# Patient Record
Sex: Male | Born: 1955
Health system: Southern US, Community
[De-identification: ages and names within clinical notes are randomized; demographics above are authoritative.]

## PROBLEM LIST (undated history)

## (undated) DIAGNOSIS — N529 Male erectile dysfunction, unspecified: Secondary | ICD-10-CM

## (undated) DIAGNOSIS — Z86010 Personal history of colonic polyps: Secondary | ICD-10-CM

## (undated) DIAGNOSIS — Z860101 Personal history of adenomatous and serrated colon polyps: Secondary | ICD-10-CM

## (undated) DIAGNOSIS — J302 Other seasonal allergic rhinitis: Secondary | ICD-10-CM

## (undated) DIAGNOSIS — M1990 Unspecified osteoarthritis, unspecified site: Secondary | ICD-10-CM

## (undated) HISTORY — PX: KNEE ARTHROSCOPY: SUR90

## (undated) HISTORY — PX: APPENDECTOMY: SHX54

## (undated) HISTORY — PX: COLONOSCOPY: SHX174

---

## 1998-09-06 ENCOUNTER — Emergency Department (HOSPITAL_COMMUNITY): Admission: EM | Admit: 1998-09-06 | Discharge: 1998-09-06 | Payer: Self-pay | Admitting: Emergency Medicine

## 2002-07-14 ENCOUNTER — Ambulatory Visit (HOSPITAL_BASED_OUTPATIENT_CLINIC_OR_DEPARTMENT_OTHER): Admission: RE | Admit: 2002-07-14 | Discharge: 2002-07-14 | Payer: Self-pay | Admitting: Orthopedic Surgery

## 2006-05-26 ENCOUNTER — Emergency Department (HOSPITAL_COMMUNITY): Admission: EM | Admit: 2006-05-26 | Discharge: 2006-05-26 | Payer: Self-pay | Admitting: Emergency Medicine

## 2014-04-04 ENCOUNTER — Emergency Department (HOSPITAL_COMMUNITY)
Admission: EM | Admit: 2014-04-04 | Discharge: 2014-04-04 | Disposition: A | Discharge status: Home / Self Care | Payer: BC Managed Care – PPO | Attending: Emergency Medicine | Admitting: Emergency Medicine

## 2014-04-04 ENCOUNTER — Encounter (HOSPITAL_COMMUNITY): Payer: Self-pay | Admitting: Emergency Medicine

## 2014-04-04 DIAGNOSIS — R21 Rash and other nonspecific skin eruption: Secondary | ICD-10-CM | POA: Diagnosis present

## 2014-04-04 DIAGNOSIS — L509 Urticaria, unspecified: Secondary | ICD-10-CM | POA: Diagnosis not present

## 2014-04-04 DIAGNOSIS — Z791 Long term (current) use of non-steroidal anti-inflammatories (NSAID): Secondary | ICD-10-CM | POA: Diagnosis not present

## 2014-04-04 DIAGNOSIS — Z79899 Other long term (current) drug therapy: Secondary | ICD-10-CM | POA: Diagnosis not present

## 2014-04-04 MED ORDER — DIPHENHYDRAMINE HCL 25 MG PO TABS: 25.0000 mg | ORAL_TABLET | Freq: Four times a day (QID) | ORAL | Status: AC | PRN

## 2014-04-04 MED ORDER — PREDNISONE 20 MG PO TABS: 40.0000 mg | ORAL_TABLET | Freq: Every day | ORAL | Status: AC

## 2014-04-04 MED ORDER — DIPHENHYDRAMINE HCL 25 MG PO CAPS
50.0000 mg | ORAL_CAPSULE | Freq: Once | ORAL | Status: AC
  Administered 2014-04-04: 50 mg via ORAL
  Filled 2014-04-04: qty 2

## 2014-04-04 MED ORDER — PREDNISONE 20 MG PO TABS
60.0000 mg | ORAL_TABLET | Freq: Once | ORAL | Status: AC
  Administered 2014-04-04: 60 mg via ORAL
  Filled 2014-04-04: qty 3

## 2014-04-04 NOTE — ED Notes (Signed)
Pt presents with hives to arms, legs, feet hands @ 45 min ago. unkn allergin. No airway involvement at this time.

## 2014-04-04 NOTE — Discharge Instructions (Signed)
Allergies °Allergies may happen from anything your body is sensitive to. This may be food, medicines, pollens, chemicals, and nearly anything around you in everyday life that produces allergens. An allergen is anything that causes an allergy producing substance. Heredity is often a factor in causing these problems. This means you may have some of the same allergies as your parents. °Food allergies happen in all age groups. Food allergies are some of the most severe and life threatening. Some common food allergies are cow's milk, seafood, eggs, nuts, wheat, and soybeans. °SYMPTOMS  °· Swelling around the mouth. °· An itchy red rash or hives. °· Vomiting or diarrhea. °· Difficulty breathing. °SEVERE ALLERGIC REACTIONS ARE LIFE-THREATENING. °This reaction is called anaphylaxis. It can cause the mouth and throat to swell and cause difficulty with breathing and swallowing. In severe reactions only a trace amount of food (for example, peanut oil in a salad) may cause death within seconds. °Seasonal allergies occur in all age groups. These are seasonal because they usually occur during the same season every year. They may be a reaction to molds, grass pollens, or tree pollens. Other causes of problems are house dust mite allergens, pet dander, and mold spores. The symptoms often consist of nasal congestion, a runny itchy nose associated with sneezing, and tearing itchy eyes. There is often an associated itching of the mouth and ears. The problems happen when you come in contact with pollens and other allergens. Allergens are the particles in the air that the body reacts to with an allergic reaction. This causes you to release allergic antibodies. Through a chain of events, these eventually cause you to release histamine into the blood stream. Although it is meant to be protective to the body, it is this release that causes your discomfort. This is why you were given anti-histamines to feel better.  If you are unable to  pinpoint the offending allergen, it may be determined by skin or blood testing. Allergies cannot be cured but can be controlled with medicine. °Hay fever is a collection of all or some of the seasonal allergy problems. It may often be treated with simple over-the-counter medicine such as diphenhydramine. Take medicine as directed. Do not drink alcohol or drive while taking this medicine. Check with your caregiver or package insert for child dosages. °If these medicines are not effective, there are many new medicines your caregiver can prescribe. Stronger medicine such as nasal spray, eye drops, and corticosteroids may be used if the first things you try do not work well. Other treatments such as immunotherapy or desensitizing injections can be used if all else fails. Follow up with your caregiver if problems continue. These seasonal allergies are usually not life threatening. They are generally more of a nuisance that can often be handled using medicine. °HOME CARE INSTRUCTIONS  °· If unsure what causes a reaction, keep a diary of foods eaten and symptoms that follow. Avoid foods that cause reactions. °· If hives or rash are present: °¨ Take medicine as directed. °¨ You may use an over-the-counter antihistamine (diphenhydramine) for hives and itching as needed. °¨ Apply cold compresses (cloths) to the skin or take baths in cool water. Avoid hot baths or showers. Heat will make a rash and itching worse. °· If you are severely allergic: °¨ Following a treatment for a severe reaction, hospitalization is often required for closer follow-up. °¨ Wear a medic-alert bracelet or necklace stating the allergy. °¨ You and your family must learn how to give adrenaline or use   an anaphylaxis kit. °· If you have had a severe reaction, always carry your anaphylaxis kit or EpiPen® with you. Use this medicine as directed by your caregiver if a severe reaction is occurring. Failure to do so could have a fatal outcome. °SEEK MEDICAL  CARE IF: °· You suspect a food allergy. Symptoms generally happen within 30 minutes of eating a food. °· Your symptoms have not gone away within 2 days or are getting worse. °· You develop new symptoms. °· You want to retest yourself or your child with a food or drink you think causes an allergic reaction. Never do this if an anaphylactic reaction to that food or drink has happened before. Only do this under the care of a caregiver. °SEEK IMMEDIATE MEDICAL CARE IF:  °· You have difficulty breathing, are wheezing, or have a tight feeling in your chest or throat. °· You have a swollen mouth, or you have hives, swelling, or itching all over your body. °· You have had a severe reaction that has responded to your anaphylaxis kit or an EpiPen®. These reactions may return when the medicine has worn off. These reactions should be considered life threatening. °MAKE SURE YOU:  °· Understand these instructions. °· Will watch your condition. °· Will get help right away if you are not doing well or get worse. °Document Released: 08/21/2002 Document Revised: 09/22/2012 Document Reviewed: 01/26/2008 °ExitCare® Patient Information ©2015 [employer]. This information is not intended to replace advice given to you by your health care provider. Make sure you discuss any questions you have with your health care provider. °Hives °Hives are itchy, red, swollen areas of the skin. They can vary in size and location on your body. Hives can come and go for hours or several days (acute hives) or for several weeks (chronic hives). Hives do not spread from person to person (noncontagious). They may get worse with scratching, exercise, and emotional stress. °CAUSES  °· Allergic reaction to food, additives, or drugs. °· Infections, including the common cold. °· Illness, such as vasculitis, lupus, or thyroid disease. °· Exposure to sunlight, heat, or cold. °· Exercise. °· Stress. °· Contact with chemicals. °SYMPTOMS  °· Red or white swollen  patches on the skin. The patches may change size, shape, and location quickly and repeatedly. °· Itching. °· Swelling of the hands, feet, and face. This may occur if hives develop deeper in the skin. °DIAGNOSIS  °Your caregiver can usually tell what is wrong by performing a physical exam. Skin or blood tests may also be done to determine the cause of your hives. In some cases, the cause cannot be determined. °TREATMENT  °Mild cases usually get better with medicines such as antihistamines. Severe cases may require an emergency epinephrine injection. If the cause of your hives is known, treatment includes avoiding that trigger.  °HOME CARE INSTRUCTIONS  °· Avoid causes that trigger your hives. °· Take antihistamines as directed by your caregiver to reduce the severity of your hives. Non-sedating or low-sedating antihistamines are usually recommended. Do not drive while taking an antihistamine. °· Take any other medicines prescribed for itching as directed by your caregiver. °· Wear loose-fitting clothing. °· Keep all follow-up appointments as directed by your caregiver. °SEEK MEDICAL CARE IF:  °· You have persistent or severe itching that is not relieved with medicine. °· You have painful or swollen joints. °SEEK IMMEDIATE MEDICAL CARE IF:  °· You have a fever. °· Your tongue or lips are swollen. °· You have trouble breathing   or swallowing. °· You feel tightness in the throat or chest. °· You have abdominal pain. °These problems may be the first sign of a life-threatening allergic reaction. Call your local emergency services (911 in [place] °MAKE SURE YOU:  °· Understand these instructions. °· Will watch your condition. °· Will get help right away if you are not doing well or get worse. °Document Released: 05/28/2005 Document Revised: 06/02/2013 Document Reviewed: 08/21/2011 °ExitCare® Patient Information ©2015 [employer]. This information is not intended to replace advice given to you by your health care provider.  Make sure you discuss any questions you have with your health care provider. ° °

## 2014-04-04 NOTE — ED Provider Notes (Signed)
CSN: [id number]     Arrival date & time 04/04/14  2140 History   First MD Initiated Contact with Patient 04/04/14 2157     Chief Complaint  Patient presents with  . Allergic Reaction     (Consider location/radiation/quality/duration/timing/severity/associated sxs/prior Treatment) HPI 58 year old male presents with a rash that started approximately 1 hour prior to arrival. He states he was lying in bed and felt itching starting in his lower abdomen/groin. The rashes started to spread. It started a small dots that have been coalescing and getting bigger. Denies shortness of breath, nausea, or vomiting. No throat closing sensation. No swelling. He's unsure what set this off. He has no significant allergic history has not been having any new medicines, detergents, clothes, or foods. He ate food that he cooked tonight that he's had many times before. He has a new dog but has had the dog for the past 2 weeks. He feels like the rash is spreading quickly.  History reviewed. No pertinent past medical history. Past Surgical History  Procedure Laterality Date  . Appendectomy     No family history on file. History  Substance Use Topics  . Smoking status: Never Smoker   . Smokeless tobacco: Not on file  . Alcohol Use: Yes    Review of Systems  HENT: Negative for trouble swallowing and voice change.   Respiratory: Negative for chest tightness, shortness of breath and wheezing.   Gastrointestinal: Negative for nausea and vomiting.  Skin: Positive for rash.  All other systems reviewed and are negative.     Allergies  Review of patient's allergies indicates no known allergies.  Home Medications   Prior to Admission medications   Medication Sig Start Date End Date Taking? Authorizing Provider  meloxicam (MOBIC) 15 MG tablet Take 1 tablet by mouth daily. 03/16/14   Historical Provider, MD  naproxen (NAPROSYN) 500 MG tablet Take 500 mg by mouth 2 (two) times daily as needed for mild pain.    Yes Historical Provider, MD  Red Yeast Rice 600 MG CAPS Take 1 capsule by mouth daily.   Yes Historical Provider, MD  VIAGRA 100 MG tablet Take 1 tablet by mouth as needed. Before sexual intercourse 02/04/14  Yes Historical Provider, MD   BP 133/85  Pulse 90  Temp(Src) 98.8 F (37.1 C) (Oral)  Resp 18  Ht 5\' 9"  (1.753 m)  Wt 190 lb (86.183 kg)  BMI 28.05 kg/m2  SpO2 98% Physical Exam  Nursing note and vitals reviewed. Constitutional: He is oriented to person, place, and time. He appears well-developed and well-nourished.  HENT:  Head: Normocephalic and atraumatic.  Right Ear: External ear normal.  Left Ear: External ear normal.  Nose: Nose normal.  Eyes: Right eye exhibits no discharge. Left eye exhibits no discharge.  Neck: Neck supple.  Cardiovascular: Normal rate, regular rhythm, normal heart sounds and intact distal pulses.   Pulmonary/Chest: Effort normal and breath sounds normal. No stridor. He has no wheezes.  Abdominal: Soft. He exhibits no distension. There is no tenderness.  Genitourinary: Testes normal and penis normal.  Musculoskeletal: He exhibits no edema.  Neurological: He is alert and oriented to person, place, and time.  Skin: Skin is warm and dry. Rash noted. Rash is urticarial. He is not diaphoretic.     Urticarial rash, most prominent in the proximal thighs and lower abdomen. No penile or scrotal involvement. Also noticed in the upper proximal arms.    ED Course  Procedures (including critical care time) Labs Review  Labs Reviewed - No data to display  Imaging Review No results found.   EKG Interpretation None      MDM   Final diagnoses:  Hives    Patient with hives of unclear etiology. The rash seemed to rapidly improve with oral Benadryl and steroids. He has no signs of airway or GI involvement. Due to this, will treat symptomatically with oral Benadryl and a steroid burst. Patient will need outpatient follow-up with an allergist. Discussed  strict return precautions.    Audree CamelScott T Cauy Melody, MD 04/04/14 [phone]

## 2017-07-04 ENCOUNTER — Telehealth: Payer: Self-pay | Admitting: Cardiology

## 2017-07-09 NOTE — Telephone Encounter (Signed)
error 

## 2017-10-13 ENCOUNTER — Other Ambulatory Visit: Payer: Self-pay

## 2017-10-13 ENCOUNTER — Inpatient Hospital Stay (HOSPITAL_COMMUNITY)
Admission: EM | Admit: 2017-10-13 | Discharge: 2017-10-16 | DRG: 357 | Disposition: A | Discharge status: Home / Self Care | Payer: No Typology Code available for payment source | Attending: Family Medicine | Admitting: Family Medicine

## 2017-10-13 ENCOUNTER — Encounter (HOSPITAL_COMMUNITY): Payer: Self-pay | Admitting: Obstetrics and Gynecology

## 2017-10-13 DIAGNOSIS — R000 Tachycardia, unspecified: Secondary | ICD-10-CM | POA: Diagnosis not present

## 2017-10-13 DIAGNOSIS — Z86010 Personal history of colonic polyps: Secondary | ICD-10-CM | POA: Diagnosis not present

## 2017-10-13 DIAGNOSIS — Z7982 Long term (current) use of aspirin: Secondary | ICD-10-CM

## 2017-10-13 DIAGNOSIS — D696 Thrombocytopenia, unspecified: Secondary | ICD-10-CM | POA: Diagnosis present

## 2017-10-13 DIAGNOSIS — E538 Deficiency of other specified B group vitamins: Secondary | ICD-10-CM | POA: Diagnosis present

## 2017-10-13 DIAGNOSIS — K264 Chronic or unspecified duodenal ulcer with hemorrhage: Principal | ICD-10-CM | POA: Diagnosis present

## 2017-10-13 DIAGNOSIS — N529 Male erectile dysfunction, unspecified: Secondary | ICD-10-CM | POA: Diagnosis present

## 2017-10-13 DIAGNOSIS — Z79899 Other long term (current) drug therapy: Secondary | ICD-10-CM

## 2017-10-13 DIAGNOSIS — Z791 Long term (current) use of non-steroidal anti-inflammatories (NSAID): Secondary | ICD-10-CM

## 2017-10-13 DIAGNOSIS — F17290 Nicotine dependence, other tobacco product, uncomplicated: Secondary | ICD-10-CM | POA: Diagnosis present

## 2017-10-13 DIAGNOSIS — M1990 Unspecified osteoarthritis, unspecified site: Secondary | ICD-10-CM | POA: Diagnosis not present

## 2017-10-13 DIAGNOSIS — K922 Gastrointestinal hemorrhage, unspecified: Secondary | ICD-10-CM

## 2017-10-13 DIAGNOSIS — K920 Hematemesis: Secondary | ICD-10-CM | POA: Diagnosis not present

## 2017-10-13 DIAGNOSIS — T39395A Adverse effect of other nonsteroidal anti-inflammatory drugs [NSAID], initial encounter: Secondary | ICD-10-CM | POA: Diagnosis present

## 2017-10-13 DIAGNOSIS — D62 Acute posthemorrhagic anemia: Secondary | ICD-10-CM | POA: Diagnosis not present

## 2017-10-13 DIAGNOSIS — D519 Vitamin B12 deficiency anemia, unspecified: Secondary | ICD-10-CM | POA: Diagnosis not present

## 2017-10-13 DIAGNOSIS — K449 Diaphragmatic hernia without obstruction or gangrene: Secondary | ICD-10-CM | POA: Diagnosis present

## 2017-10-13 DIAGNOSIS — K921 Melena: Secondary | ICD-10-CM | POA: Diagnosis not present

## 2017-10-13 HISTORY — DX: Personal history of colonic polyps: Z86.010

## 2017-10-13 HISTORY — DX: Other seasonal allergic rhinitis: J30.2

## 2017-10-13 HISTORY — DX: Personal history of adenomatous and serrated colon polyps: Z86.0101

## 2017-10-13 HISTORY — DX: Unspecified osteoarthritis, unspecified site: M19.90

## 2017-10-13 HISTORY — DX: Male erectile dysfunction, unspecified: N52.9

## 2017-10-13 LAB — COMPREHENSIVE METABOLIC PANEL
ALK PHOS: 57 U/L (ref 38–126)
ALT: 33 U/L (ref 17–63)
AST: 29 U/L (ref 15–41)
Albumin: 4.1 g/dL (ref 3.5–5.0)
Anion gap: 12 (ref 5–15)
BUN: 29 mg/dL — AB (ref 6–20)
CHLORIDE: 104 mmol/L (ref 101–111)
CO2: 22 mmol/L (ref 22–32)
Calcium: 9.6 mg/dL (ref 8.9–10.3)
Creatinine, Ser: 1 mg/dL (ref 0.61–1.24)
GFR calc Af Amer: >60 mL/min (ref >60)
GLUCOSE: 150 mg/dL — AB (ref 65–99)
POTASSIUM: 4.1 mmol/L (ref 3.5–5.1)
Sodium: 138 mmol/L (ref 135–145)
Total Bilirubin: 1.5 mg/dL — ABNORMAL HIGH (ref 0.3–1.2)
Total Protein: 7.2 g/dL (ref 6.5–8.1)

## 2017-10-13 LAB — POC OCCULT BLOOD, ED: Fecal Occult Bld: POSITIVE — AB

## 2017-10-13 LAB — RETICULOCYTES
RBC.: 2.95 MIL/uL — AB (ref 4.22–5.81)
RETIC CT PCT: 2.9 % (ref 0.4–3.1)
Retic Count, Absolute: 85.6 10*3/uL (ref 19.0–186.0)

## 2017-10-13 LAB — CBC
HCT: 31 % — ABNORMAL LOW (ref 39.0–52.0)
HEMATOCRIT: 40.8 % (ref 39.0–52.0)
HEMOGLOBIN: 10.7 g/dL — AB (ref 13.0–17.0)
Hemoglobin: 14.1 g/dL (ref 13.0–17.0)
MCH: 35.7 pg — ABNORMAL HIGH (ref 26.0–34.0)
MCH: 35.7 pg — ABNORMAL HIGH (ref 26.0–34.0)
MCHC: 34.6 g/dL (ref 30.0–36.0)
MCHC: 34.5 g/dL (ref 30.0–36.0)
MCV: 103.3 fL — AB (ref 78.0–100.0)
MCV: 103.3 fL — ABNORMAL HIGH (ref 78.0–100.0)
PLATELETS: 162 10*3/uL (ref 150–400)
Platelets: 238 10*3/uL (ref 150–400)
RBC: 3.95 MIL/uL — ABNORMAL LOW (ref 4.22–5.81)
RBC: 3 MIL/uL — AB (ref 4.22–5.81)
RDW: 12.8 % (ref 11.5–15.5)
RDW: 13 % (ref 11.5–15.5)
WBC: 10.4 10*3/uL (ref 4.0–10.5)
WBC: 8 10*3/uL (ref 4.0–10.5)

## 2017-10-13 LAB — PROTIME-INR
INR: 1.03
Prothrombin Time: 13.4 seconds (ref 11.4–15.2)

## 2017-10-13 LAB — ABO/RH: ABO/RH(D): O POS

## 2017-10-13 MED ORDER — ONDANSETRON HCL 4 MG PO TABS: 4.0000 mg | ORAL_TABLET | Freq: Four times a day (QID) | ORAL | Status: DC | PRN

## 2017-10-13 MED ORDER — SODIUM CHLORIDE 0.9 % IV SOLN
8.0000 mg/h | INTRAVENOUS | Status: DC
  Administered 2017-10-13 – 2017-10-15 (×5): 8 mg/h via INTRAVENOUS
  Filled 2017-10-13 (×10): qty 80

## 2017-10-13 MED ORDER — FAMOTIDINE IN NACL 20-0.9 MG/50ML-% IV SOLN
20.0000 mg | Freq: Once | INTRAVENOUS | Status: AC
  Administered 2017-10-13: 20 mg via INTRAVENOUS
  Filled 2017-10-13: qty 50

## 2017-10-13 MED ORDER — PANTOPRAZOLE SODIUM 40 MG IV SOLR
40.0000 mg | Freq: Once | INTRAVENOUS | Status: AC
  Administered 2017-10-13: 40 mg via INTRAVENOUS
  Filled 2017-10-13: qty 40

## 2017-10-13 MED ORDER — DIPHENHYDRAMINE HCL 50 MG/ML IJ SOLN
25.0000 mg | Freq: Once | INTRAMUSCULAR | Status: AC
  Administered 2017-10-13: 25 mg via INTRAVENOUS
  Filled 2017-10-13: qty 1

## 2017-10-13 MED ORDER — PANTOPRAZOLE SODIUM 40 MG IV SOLR: 40.0000 mg | Freq: Once | INTRAVENOUS | Status: DC

## 2017-10-13 MED ORDER — PANTOPRAZOLE SODIUM 40 MG IV SOLR: 40.0000 mg | Freq: Two times a day (BID) | INTRAVENOUS | Status: DC

## 2017-10-13 MED ORDER — DICLOFENAC SODIUM 1 % TD GEL
2.0000 g | Freq: Four times a day (QID) | TRANSDERMAL | Status: DC
  Administered 2017-10-13 – 2017-10-16 (×7): 2 g via TOPICAL
  Filled 2017-10-13: qty 100

## 2017-10-13 MED ORDER — POTASSIUM CHLORIDE IN NACL 20-0.9 MEQ/L-% IV SOLN
INTRAVENOUS | Status: DC
  Administered 2017-10-13 – 2017-10-14 (×2): via INTRAVENOUS
  Filled 2017-10-13 (×3): qty 1000

## 2017-10-13 MED ORDER — ZOLPIDEM TARTRATE 5 MG PO TABS
5.0000 mg | ORAL_TABLET | Freq: Once | ORAL | Status: AC
  Administered 2017-10-13: 5 mg via ORAL
  Filled 2017-10-13: qty 1

## 2017-10-13 MED ORDER — SODIUM CHLORIDE 0.9 % IV SOLN
Freq: Once | INTRAVENOUS | Status: AC
Start: 2017-10-13 — End: 2017-10-13
  Administered 2017-10-13: 15:00:00 via INTRAVENOUS

## 2017-10-13 MED ORDER — PANTOPRAZOLE SODIUM 40 MG PO TBEC
40.0000 mg | DELAYED_RELEASE_TABLET | Freq: Two times a day (BID) | ORAL | Status: DC
  Administered 2017-10-13: 40 mg via ORAL
  Filled 2017-10-13: qty 1

## 2017-10-13 MED ORDER — ACETAMINOPHEN 325 MG PO TABS: 650.0000 mg | ORAL_TABLET | Freq: Four times a day (QID) | ORAL | Status: DC | PRN

## 2017-10-13 MED ORDER — ONDANSETRON HCL 4 MG/2ML IJ SOLN: 4.0000 mg | Freq: Four times a day (QID) | INTRAMUSCULAR | Status: DC | PRN

## 2017-10-13 MED ORDER — ACETAMINOPHEN 650 MG RE SUPP: 650.0000 mg | Freq: Four times a day (QID) | RECTAL | Status: DC | PRN

## 2017-10-13 MED ORDER — SODIUM CHLORIDE 0.9 % IV BOLUS
1000.0000 mL | Freq: Once | INTRAVENOUS | Status: AC
  Administered 2017-10-13: 1000 mL via INTRAVENOUS

## 2017-10-13 MED ORDER — METOCLOPRAMIDE HCL 5 MG/ML IJ SOLN
10.0000 mg | Freq: Once | INTRAMUSCULAR | Status: AC
  Administered 2017-10-13: 10 mg via INTRAVENOUS
  Filled 2017-10-13: qty 2

## 2017-10-13 NOTE — ED Triage Notes (Signed)
Per Pt: Pt reports he has been having dark stools with blood and has had multiple rounds of emesis with blood. Pt feels weak and just wants to sleep. Pt is a current drinker and reports about 8 drinks per week.

## 2017-10-13 NOTE — ED Provider Notes (Addendum)
[facility]-EMERGENCY DEPT Provider Note   CSN: [id number] Arrival date & time: 10/13/17  1206     History   Chief Complaint Chief Complaint  Patient presents with  . Blood In Stools  . Hematemesis    HPI Kenneth Robbins is a 62 y.o. male.  HPI 62 yo M with no significant PMHx here with nausea, black emesis. Pt reports that over the last 3-4 days, he's had progressively worsening nausea, intermittent epigastric pain, and vomiting. His emesis began as blood-streaked then has been dark, tarry, with coffee grounds. He's begun having dark, tarry BM as well. He reports associated severe fatigue, lightheadedness, and general malaise. Pain is cramp like, intermittent, and is not severe. No pain currently. Of note, he uses [employer] daily for his arthritis. He also drinks 4-6 shots/alcohol equivalents on 3-5 days a week. No blood thinner use. No h/o GI bleeds.   Past Medical History:  Diagnosis Date  . Erectile dysfunction   . Hx of adenomatous polyp of colon   . Osteoarthritis   . Seasonal allergies     Patient Active Problem List   Diagnosis Date Noted  . Arthritis 10/13/2017  . Tachycardia 10/13/2017  . Upper GI bleed 10/13/2017    Past Surgical History:  Procedure Laterality Date  . APPENDECTOMY    . COLONOSCOPY    . ESOPHAGOGASTRODUODENOSCOPY (EGD) WITH PROPOFOL N/A 10/14/2017   Procedure: ESOPHAGOGASTRODUODENOSCOPY (EGD) WITH PROPOFOL;  Surgeon: Kathi Der, MD;  Location: [facility] ENDOSCOPY;  Service: Gastroenterology;  Laterality: N/A;  . IR ANGIOGRAM SELECTIVE EACH ADDITIONAL VESSEL  10/14/2017  . IR ANGIOGRAM VISCERAL SELECTIVE  10/14/2017  . IR ANGIOGRAM VISCERAL SELECTIVE  10/14/2017  . IR EMBO ART  VEN HEMORR LYMPH EXTRAV  INC GUIDE ROADMAPPING  10/14/2017  . IR US GUIDE VASC ACCESS RIGHT  10/14/2017  . KNEE ARTHROSCOPY Bilateral         Home Medications    Prior to Admission medications   Medication Sig Start Date End Date Taking? Authorizing Provider   diphenhydrAMINE (BENADRYL) 25 MG tablet Take 1-2 tablets (25-50 mg total) by mouth every 6 (six) hours as needed for itching or allergies. 04/04/14  Yes Pricilla Loveless, MD  fluticasone (FLONASE) 50 MCG/ACT nasal spray Place 1 spray into both nostrils daily.   Yes [provider]  Multiple Vitamin (MULTIVITAMIN WITH MINERALS) TABS tablet Take 1 tablet by mouth daily.   Yes [provider]  VIAGRA 100 MG tablet Take 1 tablet by mouth as needed. Before sexual intercourse 02/04/14  Yes [provider]  pantoprazole (PROTONIX) 40 MG tablet Take 1 tablet (40 mg total) by mouth 2 (two) times daily before a meal. 10/16/17   Penny Pia, MD    Family History Family History  Problem Relation Age of Onset  . Cancer Mother   . Heart failure Father     Social History Social History   Tobacco Use  . Smoking status: Light Tobacco Smoker    Types: Cigars  . Smokeless tobacco: Never Used  Substance Use Topics  . Alcohol use: Yes    Comment: 8 drinks per week  . Drug use: Yes    Types: Marijuana     Allergies   Patient has no known allergies.   Review of Systems Review of Systems  Constitutional: Negative for chills, fatigue and fever.  HENT: Negative for congestion and rhinorrhea.   Eyes: Negative for visual disturbance.  Respiratory: Negative for cough, shortness of breath and wheezing.   Cardiovascular:  Negative for chest pain and leg swelling.  Gastrointestinal: Positive for abdominal pain, blood in stool, nausea and vomiting. Negative for diarrhea.  Genitourinary: Negative for dysuria and flank pain.  Musculoskeletal: Negative for neck pain and neck stiffness.  Skin: Negative for rash and wound.  Allergic/Immunologic: Negative for immunocompromised state.  Neurological: Negative for syncope, weakness and headaches.  All other systems reviewed and are negative.    Physical Exam Updated Vital Signs BP 124/66 (BP Location: Left Arm)   Pulse 87   Temp  98.6 F (37 C) (Oral)   Resp 18   Ht  (1.753 m)   Wt 78.9 kg (173 lb 15.1 oz)   SpO2 99%   BMI 25.69 kg/m   Physical Exam  Constitutional: He is oriented to person, place, and time. He appears well-developed and well-nourished. No distress.  HENT:  Head: Normocephalic and atraumatic.  Eyes: Conjunctivae are normal.  Neck: Neck supple.  Cardiovascular: Normal rate, regular rhythm and normal heart sounds. Exam reveals no friction rub.  No murmur heard. Pulmonary/Chest: Effort normal and breath sounds normal. No respiratory distress. He has no wheezes. He has no rales.  Abdominal: Bowel sounds are normal. He exhibits no distension. There is no tenderness. There is no rigidity, no rebound and no guarding.  Genitourinary:  Genitourinary Comments: Gross melena noted. No bright red blood  Musculoskeletal: He exhibits no edema.  Neurological: He is alert and oriented to person, place, and time. He exhibits normal muscle tone.  Skin: Skin is warm. Capillary refill takes less than 2 seconds.  Psychiatric: He has a normal mood and affect.  Nursing note and vitals reviewed.    ED Treatments / Results  Labs (all labs ordered are listed, but only abnormal results are displayed) Labs Reviewed  COMPREHENSIVE METABOLIC PANEL - Abnormal; Notable for the following components:      Result Value   Glucose, Bld 150 (*)    BUN 29 (*)    Total Bilirubin 1.5 (*)    All other components within normal limits  CBC - Abnormal; Notable for the following components:   RBC 3.95 (*)    MCV 103.3 (*)    MCH 35.7 (*)    All other components within normal limits  CBC - Abnormal; Notable for the following components:   RBC 3.00 (*)    Hemoglobin 10.7 (*)    HCT 31.0 (*)    MCV 103.3 (*)    MCH 35.7 (*)    All other components within normal limits  CBC - Abnormal; Notable for the following components:   RBC 2.07 (*)    Hemoglobin 7.6 (*)    HCT 21.5 (*)    MCV 103.9 (*)    MCH 36.7 (*)     Platelets 112 (*)    All other components within normal limits  VITAMIN B12 - Abnormal; Notable for the following components:   Vitamin B-12 123 (*)    All other components within normal limits  RETICULOCYTES - Abnormal; Notable for the following components:   RBC. 2.95 (*)    All other components within normal limits  BASIC METABOLIC PANEL - Abnormal; Notable for the following components:   Chloride 117 (*)    CO2 18 (*)    BUN 24 (*)    Calcium 6.5 (*)    Anion gap 4 (*)    All other components within normal limits  BASIC METABOLIC PANEL - Abnormal; Notable for the following components:   Glucose, Bld  104 (*)    Calcium 7.8 (*)    All other components within normal limits  HEMOGLOBIN AND HEMATOCRIT, BLOOD - Abnormal; Notable for the following components:   Hemoglobin 10.0 (*)    HCT 28.7 (*)    All other components within normal limits  CBC - Abnormal; Notable for the following components:   RBC 2.52 (*)    Hemoglobin 8.8 (*)    HCT 25.0 (*)    MCH 34.9 (*)    Platelets 104 (*)    All other components within normal limits  CBC - Abnormal; Notable for the following components:   RBC 2.51 (*)    Hemoglobin 8.8 (*)    HCT 24.9 (*)    MCH 35.1 (*)    Platelets 122 (*)    All other components within normal limits  BASIC METABOLIC PANEL - Abnormal; Notable for the following components:   Glucose, Bld 108 (*)    Calcium 8.4 (*)    All other components within normal limits  CBC - Abnormal; Notable for the following components:   RBC 2.54 (*)    Hemoglobin 8.8 (*)    HCT 25.2 (*)    MCH 34.6 (*)    Platelets 132 (*)    All other components within normal limits  POC OCCULT BLOOD, ED - Abnormal; Notable for the following components:   Fecal Occult Bld POSITIVE (*)    All other components within normal limits  MRSA PCR SCREENING  PROTIME-INR  HIV ANTIBODY (ROUTINE TESTING)  FOLATE  IRON AND TIBC  FERRITIN  POC OCCULT BLOOD, ED  TYPE AND SCREEN  ABO/RH  PREPARE RBC  (CROSSMATCH)  SURGICAL PATHOLOGY    EKG None  Radiology No results found.  Procedures .Critical Care Performed by: Shaune Pollack, MD Authorized by: Shaune Pollack, MD   Critical care provider statement:    Critical care time (minutes):  35   Critical care time was exclusive of:  Separately billable procedures and treating other patients and teaching time   Critical care was necessary to treat or prevent imminent or life-threatening deterioration of the following conditions:  Circulatory failure and cardiac failure   Critical care was time spent personally by me on the following activities:  Development of treatment plan with patient or surrogate, discussions with consultants, evaluation of patient's response to treatment, examination of patient, obtaining history from patient or surrogate, ordering and performing treatments and interventions, ordering and review of laboratory studies, ordering and review of radiographic studies, pulse oximetry, re-evaluation of patient's condition and review of old charts   I assumed direction of critical care for this patient from another provider in my specialty: no     (including critical care time)  Medications Ordered in ED Medications  lidocaine (XYLOCAINE) 1 % (with pres) injection (has no administration in time range)  midazolam (VERSED) 2 MG/2ML injection (has no administration in time range)  fentaNYL (SUBLIMAZE) 100 MCG/2ML injection (has no administration in time range)  sodium chloride 0.9 % bolus 1,000 mL (0 mLs Intravenous Stopped 10/13/17 1618)  pantoprazole (PROTONIX) injection 40 mg (40 mg Intravenous Given 10/13/17 1522)  0.9 %  sodium chloride infusion ( Intravenous Stopped 10/13/17 1922)  famotidine (PEPCID) IVPB 20 mg premix (0 mg Intravenous Stopped 10/13/17 1618)  metoCLOPramide (REGLAN) injection 10 mg (10 mg Intravenous Given 10/13/17 1521)  diphenhydrAMINE (BENADRYL) injection 25 mg (25 mg Intravenous Given 10/13/17 1521)    zolpidem (AMBIEN) tablet 5 mg (5 mg Oral Given 10/13/17 2306)  midazolam (VERSED) injection (1 mg Intravenous Given 10/14/17 0956)  fentaNYL (SUBLIMAZE) injection (50 mcg Intravenous Given 10/14/17 0935)  lidocaine (PF) (XYLOCAINE) 1 % injection (5 mLs  Given 10/14/17 0933)  iopamidol (ISOVUE-300) 61 % injection 100 mL (35 mLs Intra-arterial Contrast Given 10/14/17 1020)  cyanocobalamin ((VITAMIN B-12)) injection 1,000 mcg (1,000 mcg Subcutaneous Given 10/16/17 1110)     Initial Impression / Assessment and Plan / ED Course  I have reviewed the triage vital signs and the nursing notes.  Pertinent labs & imaging results that were available during my care of the patient were reviewed by me and considered in my medical decision making (see chart for details).  Clinical Course as of Oct 28 1317  Sun Oct 13, 2017  1437 62 yo M here with nausea, coffee-ground emesis, and gross melena. Suspect UGIB 2/2 NSAID and EtOH use. BUN elevated at 29. Hgb is stable. Type and screen sent. IV PPI, pepcid given. Will consult GI, plan for admission.   [CI]  1517 D/w Dr. Leone Payor - clear liquids OK today, NPO at [facility] for scope in morning.   [CI]    Clinical Course User Index [CI] Shaune Pollack, MD    Final Clinical Impressions(s) / ED Diagnoses   Final diagnoses:  Upper GI bleed    ED Discharge Orders        Ordered    pantoprazole (PROTONIX) 40 MG tablet  2 times daily before meals     10/16/17 1301    Increase activity slowly     10/16/17 1301    Diet - low sodium heart healthy     10/16/17 1301    Call MD for:  temperature >100.4     10/16/17 1301    Call MD for:  severe uncontrolled pain     10/16/17 1301    Discharge instructions    Comments:  Please follow up with the [facility] gastroenterologist after hospital discharge.   10/16/17 1301       Shaune Pollack, MD 10/13/17 1517    Shaune Pollack, MD 10/28/17 1319

## 2017-10-13 NOTE — ED Notes (Signed)
Bed: NW29 Expected date:  Expected time:  Means of arrival:  Comments: Rm 19

## 2017-10-13 NOTE — H&P (Signed)
History and Physical    Kenneth Robbins ZOX:[id number] DOB: [birth year] DOA: 10/13/2017    PCP: Catha Gosselin, MD  Patient coming from: home  Chief Complaint: bloody vomitus and stool    HPI: Kenneth Robbins is a 62 y.o. male with medical history of arthritis who is prescribed naproxen but mainly takes 1 to 2 packs of Goody powders a day.  About 2 days ago he began having bloody vomitus and bloody stools and subsequently became lightheaded.  His last episode of bloody stool was this morning.  Prior to this he was not having any nausea, loss of appetite, abdominal pain or weight loss.  He also mentions that he drinks at least 2 glasses of wine every evening and sometimes other liquor.  He has no history of gastric ulcers or reflux.  He had a colonoscopy a couple of years ago and only a small polyp was found he states.   ED Course: Heart rate 126, orthostatic vital signs blood pressure 163 BUN 29, creatinine 1.0, hemoglobin 14.1, MCV 103.3, INR 1.3  Review of Systems:  All other systems reviewed and apart from HPI, are negative.  Family history: Mother died of lung cancer and father died of heart attack in his 27s  Past Surgical History:  Procedure Laterality Date  . APPENDECTOMY    . COLONOSCOPY    . KNEE ARTHROSCOPY Bilateral     Social History:   reports that he has been smoking cigars.  He has never used smokeless tobacco. He reports that he drinks alcohol. He reports that he has current or past drug history. Drug: Marijuana.  He is retired from his job as a Scientist, water quality but currently works at [employer]  No Known Allergies  Family History  Problem Relation Age of Onset  . Cancer Mother   . Heart failure Father      Prior to Admission medications   Medication Sig Start Date End Date Taking? Authorizing Provider  Aspirin-Caffeine (BC FAST PAIN RELIEF PO) Take 1-2 packets by mouth daily as needed (hand pain).   Yes [provider]  diphenhydrAMINE (BENADRYL) 25 MG  tablet Take 1-2 tablets (25-50 mg total) by mouth every 6 (six) hours as needed for itching or allergies. 04/04/14  Yes Pricilla Loveless, MD  fluticasone (FLONASE) 50 MCG/ACT nasal spray Place 1 spray into both nostrils daily.   Yes [provider]  Multiple Vitamin (MULTIVITAMIN WITH MINERALS) TABS tablet Take 1 tablet by mouth daily.   Yes [provider]  naproxen (NAPROSYN) 500 MG tablet Take 500 mg by mouth 2 (two) times daily as needed for mild pain.   Yes [provider]  VIAGRA 100 MG tablet Take 1 tablet by mouth as needed. Before sexual intercourse 02/04/14  Yes [provider]    Physical Exam: Wt Readings from Last 3 Encounters:  10/13/17 73.5 kg (162 lb)  04/04/14 86.2 kg (190 lb)   Vitals:   10/13/17 1233 10/13/17 1400 10/13/17 1500 10/13/17 1557  BP:  (!) 118/91 118/86 127/83  Pulse:  95 98 (!) 104  Resp:  Temp:      TempSrc:      SpO2:  99% 100% 100%  Weight: 73.5 kg (162 lb)     Height:  (1.753 m)         Constitutional: NAD, calm, comfortable Eyes: PERTLA, lids and conjunctivae normal ENMT: Mucous membranes are moist. Posterior pharynx clear of any exudate or lesions. Normal dentition.  Neck:  normal, supple, no masses, no thyromegaly Respiratory: clear to auscultation bilaterally, no wheezing, no crackles. Normal respiratory effort. No accessory muscle use.  Cardiovascular: S1 & S2 heard, regular rate and rhythm, no murmurs / rubs / gallops. No extremity edema. 2+ pedal pulses. No carotid bruits.  Abdomen: No distension, mild tenderness in the epigastrium, no masses palpated. No hepatosplenomegaly. Bowel sounds normal.  Musculoskeletal: no clubbing / cyanosis. No joint deformity upper and lower extremities. Good ROM, no contractures. Normal muscle tone.  Skin: no rashes, lesions, ulcers. No induration Neurologic: CN 2-12 grossly intact. Sensation intact, DTR normal. Strength 5/5 in all 4 limbs.  Psychiatric: Normal  judgment and insight. Alert and oriented x 3. Normal mood.     Labs on Admission: I have personally reviewed following labs and imaging studies  CBC: Recent Labs  Lab 10/13/17 1252  WBC 10.4  HGB 14.1  HCT 40.8  MCV 103.3*  PLT 238   Basic Metabolic Panel: Recent Labs  Lab 10/13/17 1252  NA 138  K 4.1  CL 104  CO2 22  GLUCOSE 150*  BUN 29*  CREATININE 1.00  CALCIUM 9.6   GFR: Estimated Creatinine Clearance: 75.6 mL/min (by C-G formula based on SCr of 1 mg/dL). Liver Function Tests: Recent Labs  Lab 10/13/17 1252  AST 29  ALT 33  ALKPHOS 57  BILITOT 1.5*  PROT 7.2  ALBUMIN 4.1   No results for input(s): LIPASE, AMYLASE in the last 168 hours. No results for input(s): AMMONIA in the last 168 hours. Coagulation Profile: Recent Labs  Lab 10/13/17 1252  INR 1.03   Cardiac Enzymes: No results for input(s): CKTOTAL, CKMB, CKMBINDEX, TROPONINI in the last 168 hours. BNP (last 3 results) No results for input(s): PROBNP in the last 8760 hours. HbA1C: No results for input(s): HGBA1C in the last 72 hours. CBG: No results for input(s): GLUCAP in the last 168 hours. Lipid Profile: No results for input(s): CHOL, HDL, LDLCALC, TRIG, CHOLHDL, LDLDIRECT in the last 72 hours. Thyroid Function Tests: No results for input(s): TSH, T4TOTAL, FREET4, T3FREE, THYROIDAB in the last 72 hours. Anemia Panel: No results for input(s): VITAMINB12, FOLATE, FERRITIN, TIBC, IRON, RETICCTPCT in the last 72 hours. Urine analysis: No results found for: COLORURINE, APPEARANCEUR, LABSPEC, PHURINE, GLUCOSEU, HGBUR, BILIRUBINUR, KETONESUR, PROTEINUR, UROBILINOGEN, NITRITE, LEUKOCYTESUR Sepsis Labs: (procalcitonin:4,lacticidven:4) )No results found for this or any previous visit (from the past 240 hour(s)).   Radiological Exams on Admission: No results found.   Assessment/Plan Principal Problem:   Upper GI bleed- lightheaded-tachycardia -With hematemesis and melena -We  will be checking his hemoglobin every 6 hours and will type and cross him -Continue IV fluids and monitor I and O -Bedrest only for tonight -Clear liquids and then n.p.o. after midnight -Protonix twice daily-this will be oral as IV is on backorder-he has received a dose of IV Pepcid in the ED as well -[facility] has evaluated the patient and will set him up for an endoscopy tomorrow which will need to be done by [facility] as his PCP is with [facility]  Active Problems:   Arthritis -We will need to stop all NSAIDs -I will order Voltaren gel for him-    DVT prophylaxis: SCDs Code Status: Full code Family Communication: Spouse Kenneth Robbins Disposition Plan: Stepdown unit Consults called: [facility] called by ED Admission status: Inpatient   Calvert Cantor MD [facility] Pager: [email or web address] Password [email or web address] 7PM-7AM, please contact night-coverage   10/13/2017, 4:17 PM

## 2017-10-13 NOTE — H&P (View-Only) (Signed)
Consultation  Referring Provider:     [facility] Dr. Butler Denmark Primary Care Physician:  Catha Gosselin, MD Primary Gastroenterologist:        [facility] Reason for Consultation:     GI bleed     Impression / Plan:   Melena, coffee ground emesis in setting of BC powder use - upper GI bleed quite likely PUD from salicylate use  Needs IVF, PPI, serial Hgb and EGD tomorrow. I have discussed w/ Dr. Georgiann Cocker of [facility] and he will perform the procedure (patient sees [facility])  The risks and benefits as well as alternatives of endoscopic procedure(s) have been discussed and reviewed. All questions answered. The patient agrees to proceed.  I appreciate the opportunity to care for this patient.  Please contact [facility] on call for any further ?  Iva Boop, MD, [facility] 10/13/2017 4:15 PM         HPI:   Kenneth Robbins is a 62 y.o. male with PMH of osteoarthritis, has been usin at least 2 BC powders a day for painful hands. He started to feel bad 2 d ago, nausea, anorexia and then went on to have melenic stools and vomited coffee grounds more than once. He presented to ED and was tachycardic, that improved with IVF. No prior hx bleeding - no heartburn, dysphagia or other chronic GI issues. Hx colon polyp about 3-4 yrs ago - [facility].  Past Medical History:  Diagnosis Date  . Erectile dysfunction   . Hx of adenomatous polyp of colon   . Osteoarthritis   . Seasonal allergies     Past Surgical History:  Procedure Laterality Date  . APPENDECTOMY    . COLONOSCOPY    . KNEE ARTHROSCOPY Bilateral     Family History  Problem Relation Age of Onset  . Cancer Mother   . Heart failure Father     Social History   Tobacco Use  . Smoking status: Light Tobacco Smoker    Types: Cigars  . Smokeless tobacco: Never Used  Substance Use Topics  . Alcohol use: Yes    Comment: 8 drinks per week  . Drug use: Yes    Types: Marijuana    Prior to Admission medications     Medication Sig Start Date End Date Taking? Authorizing Provider  Aspirin-Caffeine (BC FAST PAIN RELIEF PO) Take 1-2 packets by mouth daily as needed (hand pain).   Yes [provider]  diphenhydrAMINE (BENADRYL) 25 MG tablet Take 1-2 tablets (25-50 mg total) by mouth every 6 (six) hours as needed for itching or allergies. 04/04/14  Yes Pricilla Loveless, MD  fluticasone (FLONASE) 50 MCG/ACT nasal spray Place 1 spray into both nostrils daily.   Yes [provider]  Multiple Vitamin (MULTIVITAMIN WITH MINERALS) TABS tablet Take 1 tablet by mouth daily.   Yes [provider]  naproxen (NAPROSYN) 500 MG tablet Take 500 mg by mouth 2 (two) times daily as needed for mild pain.   Yes [provider]  VIAGRA 100 MG tablet Take 1 tablet by mouth as needed. Before sexual intercourse 02/04/14  Yes [provider]    Current Facility-Administered Medications  Medication Dose Route Frequency Provider Last Rate Last Dose  . 0.9 % NaCl with KCl 20 mEq/ L  infusion   Intravenous Continuous Rizwan, Saima, MD      . acetaminophen (TYLENOL) tablet 650 mg  650 mg Oral Q6H PRN Calvert Cantor, MD  Or  . acetaminophen (TYLENOL) suppository 650 mg  650 mg Rectal Q6H PRN Calvert Cantor, MD      . ondansetron (ZOFRAN) tablet 4 mg  4 mg Oral Q6H PRN Calvert Cantor, MD       Or  . ondansetron (ZOFRAN) injection 4 mg  4 mg Intravenous Q6H PRN Calvert Cantor, MD       Current Outpatient Medications  Medication Sig Dispense Refill  . Aspirin-Caffeine (BC FAST PAIN RELIEF PO) Take 1-2 packets by mouth daily as needed (hand pain).    Marland Kitchen diphenhydrAMINE (BENADRYL) 25 MG tablet Take 1-2 tablets (25-50 mg total) by mouth every 6 (six) hours as needed for itching or allergies. 30 tablet 0  . fluticasone (FLONASE) 50 MCG/ACT nasal spray Place 1 spray into both nostrils daily.    . Multiple Vitamin (MULTIVITAMIN WITH MINERALS) TABS tablet Take 1 tablet by mouth daily.    . naproxen  (NAPROSYN) 500 MG tablet Take 500 mg by mouth 2 (two) times daily as needed for mild pain.    Marland Kitchen VIAGRA 100 MG tablet Take 1 tablet by mouth as needed. Before sexual intercourse      Allergies as of 10/13/2017  . (No Known Allergies)     Review of Systems:    This is positive for those things mentioned in the HPI All other review of systems are negative.       Physical Exam:  Vital signs in last 24 hours: Temp:  [98.3 F (36.8 C)] 98.3 F (36.8 C) (05/05 1231) Pulse Rate:  [95-126] 104 (05/05 1557) Resp:  [10-17] 17 (05/05 1557) BP: (116-127)/(83-91) 127/83 (05/05 1557) SpO2:  [99 %-100 %] 100 % (05/05 1557) Weight:  [162 lb (73.5 kg)] 162 lb (73.5 kg) (05/05 1233)    General:  Well-developed, well-nourished and in no significant distress Eyes:  anicteric. ENT:   Mouth and posterior pharynx free of lesions.  Neck:   supple w/o thyromegaly or mass.  Lungs: Clear to auscultation bilaterally. Heart:  S1S2, no rubs, murmurs, gallops. Abdomen:  soft, non-tender, no hepatosplenomegaly, hernia, or mass and BS+.  Lymph:  no cervical or supraclavicular adenopathy. Extremities:   no edema Skin   no rash. Neuro:  A&O x 3.  Psych:  appropriate mood and  Affect.   Data Reviewed:   LAB RESULTS: Recent Labs    10/13/17 1252  WBC 10.4  HGB 14.1  HCT 40.8  PLT 238   BMET Recent Labs    10/13/17 1252  NA 138  K 4.1  CL 104  CO2 22  GLUCOSE 150*  BUN 29*  CREATININE 1.00  CALCIUM 9.6   LFT Recent Labs    10/13/17 1252  PROT 7.2  ALBUMIN 4.1  AST 29  ALT 33  ALKPHOS 57  BILITOT 1.5*   PT/INR Recent Labs    10/13/17 1252  LABPROT 13.4  INR 1.03      Thanks   LOS: 0 days    Sena Slate, MD, [facility] @  10/13/2017, 4:09 PM

## 2017-10-13 NOTE — Consult Note (Signed)
   Consultation  Referring Provider:     [facility] Dr. Rizwan Primary Care Physician:  Little, Kevin, MD Primary Gastroenterologist:        [facility] Reason for Consultation:     GI bleed     Impression / Plan:   Melena, coffee ground emesis in setting of BC powder use - upper GI bleed quite likely PUD from salicylate use  Needs IVF, PPI, serial Hgb and EGD tomorrow. I have discussed w/ Dr. Brahmbatt of [facility] and he will perform the procedure (patient sees [facility])  The risks and benefits as well as alternatives of endoscopic procedure(s) have been discussed and reviewed. All questions answered. The patient agrees to proceed.  I appreciate the opportunity to care for this patient.  Please contact [facility] on call for any further ?  Carl E. Gessner, MD, FACG [facility] 10/13/2017 4:15 PM         HPI:   Kenneth Robbins is a 62 y.o. male with PMH of osteoarthritis, has been usin at least 2 BC powders a day for painful hands. He started to feel bad 2 d ago, nausea, anorexia and then went on to have melenic stools and vomited coffee grounds more than once. He presented to ED and was tachycardic, that improved with IVF. No prior hx bleeding - no heartburn, dysphagia or other chronic GI issues. Hx colon polyp about 3-4 yrs ago - [facility].  Past Medical History:  Diagnosis Date  . Erectile dysfunction   . Hx of adenomatous polyp of colon   . Osteoarthritis   . Seasonal allergies     Past Surgical History:  Procedure Laterality Date  . APPENDECTOMY    . COLONOSCOPY    . KNEE ARTHROSCOPY Bilateral     Family History  Problem Relation Age of Onset  . Cancer Mother   . Heart failure Father     Social History   Tobacco Use  . Smoking status: Light Tobacco Smoker    Types: Cigars  . Smokeless tobacco: Never Used  Substance Use Topics  . Alcohol use: Yes    Comment: 8 drinks per week  . Drug use: Yes    Types: Marijuana    Prior to Admission medications     Medication Sig Start Date End Date Taking? Authorizing Provider  Aspirin-Caffeine (BC FAST PAIN RELIEF PO) Take 1-2 packets by mouth daily as needed (hand pain).   Yes [provider]  diphenhydrAMINE (BENADRYL) 25 MG tablet Take 1-2 tablets (25-50 mg total) by mouth every 6 (six) hours as needed for itching or allergies. 04/04/14  Yes Goldston, Scott, MD  fluticasone (FLONASE) 50 MCG/ACT nasal spray Place 1 spray into both nostrils daily.   Yes [provider]  Multiple Vitamin (MULTIVITAMIN WITH MINERALS) TABS tablet Take 1 tablet by mouth daily.   Yes [provider]  naproxen (NAPROSYN) 500 MG tablet Take 500 mg by mouth 2 (two) times daily as needed for mild pain.   Yes [provider]  VIAGRA 100 MG tablet Take 1 tablet by mouth as needed. Before sexual intercourse 02/04/14  Yes [provider]    Current Facility-Administered Medications  Medication Dose Route Frequency Provider Last Rate Last Dose  . 0.9 % NaCl with KCl 20 mEq/ L  infusion   Intravenous Continuous Rizwan, Saima, MD      . acetaminophen (TYLENOL) tablet 650 mg  650 mg Oral Q6H PRN Rizwan, Saima, MD         Or  . acetaminophen (TYLENOL) suppository 650 mg  650 mg Rectal Q6H PRN Calvert Cantor, MD      . ondansetron (ZOFRAN) tablet 4 mg  4 mg Oral Q6H PRN Calvert Cantor, MD       Or  . ondansetron (ZOFRAN) injection 4 mg  4 mg Intravenous Q6H PRN Calvert Cantor, MD       Current Outpatient Medications  Medication Sig Dispense Refill  . Aspirin-Caffeine (BC FAST PAIN RELIEF PO) Take 1-2 packets by mouth daily as needed (hand pain).    Marland Kitchen diphenhydrAMINE (BENADRYL) 25 MG tablet Take 1-2 tablets (25-50 mg total) by mouth every 6 (six) hours as needed for itching or allergies. 30 tablet 0  . fluticasone (FLONASE) 50 MCG/ACT nasal spray Place 1 spray into both nostrils daily.    . Multiple Vitamin (MULTIVITAMIN WITH MINERALS) TABS tablet Take 1 tablet by mouth daily.    . naproxen  (NAPROSYN) 500 MG tablet Take 500 mg by mouth 2 (two) times daily as needed for mild pain.    Marland Kitchen VIAGRA 100 MG tablet Take 1 tablet by mouth as needed. Before sexual intercourse      Allergies as of 10/13/2017  . (No Known Allergies)     Review of Systems:    This is positive for those things mentioned in the HPI All other review of systems are negative.       Physical Exam:  Vital signs in last 24 hours: Temp:  [98.3 F (36.8 C)] 98.3 F (36.8 C) (05/05 1231) Pulse Rate:  [95-126] 104 (05/05 1557) Resp:  [10-17] 17 (05/05 1557) BP: (116-127)/(83-91) 127/83 (05/05 1557) SpO2:  [99 %-100 %] 100 % (05/05 1557) Weight:  [162 lb (73.5 kg)] 162 lb (73.5 kg) (05/05 1233)    General:  Well-developed, well-nourished and in no significant distress Eyes:  anicteric. ENT:   Mouth and posterior pharynx free of lesions.  Neck:   supple w/o thyromegaly or mass.  Lungs: Clear to auscultation bilaterally. Heart:  S1S2, no rubs, murmurs, gallops. Abdomen:  soft, non-tender, no hepatosplenomegaly, hernia, or mass and BS+.  Lymph:  no cervical or supraclavicular adenopathy. Extremities:   no edema Skin   no rash. Neuro:  A&O x 3.  Psych:  appropriate mood and  Affect.   Data Reviewed:   LAB RESULTS: Recent Labs    10/13/17 1252  WBC 10.4  HGB 14.1  HCT 40.8  PLT 238   BMET Recent Labs    10/13/17 1252  NA 138  K 4.1  CL 104  CO2 22  GLUCOSE 150*  BUN 29*  CREATININE 1.00  CALCIUM 9.6   LFT Recent Labs    10/13/17 1252  PROT 7.2  ALBUMIN 4.1  AST 29  ALT 33  ALKPHOS 57  BILITOT 1.5*   PT/INR Recent Labs    10/13/17 1252  LABPROT 13.4  INR 1.03      Thanks   LOS: 0 days    Sena Slate, MD, [facility] @  10/13/2017, 4:09 PM

## 2017-10-13 NOTE — ED Notes (Signed)
Bed: WA19 Expected date:  Expected time:  Means of arrival:  Comments: 

## 2017-10-14 ENCOUNTER — Inpatient Hospital Stay (HOSPITAL_COMMUNITY): Payer: No Typology Code available for payment source | Admitting: Certified Registered Nurse Anesthetist

## 2017-10-14 ENCOUNTER — Encounter (HOSPITAL_COMMUNITY)
Admission: EM | Disposition: A | Discharge status: Home / Self Care | Payer: Self-pay | Source: Home / Self Care | Attending: Internal Medicine

## 2017-10-14 ENCOUNTER — Inpatient Hospital Stay (HOSPITAL_COMMUNITY): Payer: No Typology Code available for payment source

## 2017-10-14 ENCOUNTER — Other Ambulatory Visit: Payer: Self-pay

## 2017-10-14 ENCOUNTER — Encounter (HOSPITAL_COMMUNITY): Payer: Self-pay

## 2017-10-14 DIAGNOSIS — D519 Vitamin B12 deficiency anemia, unspecified: Secondary | ICD-10-CM

## 2017-10-14 DIAGNOSIS — D62 Acute posthemorrhagic anemia: Secondary | ICD-10-CM

## 2017-10-14 HISTORY — PX: IR EMBO ART  VEN HEMORR LYMPH EXTRAV  INC GUIDE ROADMAPPING: IMG5450

## 2017-10-14 HISTORY — PX: ESOPHAGOGASTRODUODENOSCOPY (EGD) WITH PROPOFOL: SHX5813

## 2017-10-14 HISTORY — PX: IR US GUIDE VASC ACCESS RIGHT: IMG2390

## 2017-10-14 HISTORY — PX: IR ANGIOGRAM SELECTIVE EACH ADDITIONAL VESSEL: IMG667

## 2017-10-14 HISTORY — PX: IR ANGIOGRAM VISCERAL SELECTIVE: IMG657

## 2017-10-14 LAB — PREPARE RBC (CROSSMATCH)

## 2017-10-14 LAB — IRON AND TIBC
Iron: 107 ug/dL (ref 45–182)
SATURATION RATIOS: 38 % (ref 17.9–39.5)
TIBC: 284 ug/dL (ref 250–450)
UIBC: 177 ug/dL

## 2017-10-14 LAB — BASIC METABOLIC PANEL
Anion gap: 4 — ABNORMAL LOW (ref 5–15)
BUN: 24 mg/dL — AB (ref 6–20)
CALCIUM: 6.5 mg/dL — AB (ref 8.9–10.3)
CHLORIDE: 117 mmol/L — AB (ref 101–111)
CO2: 18 mmol/L — AB (ref 22–32)
CREATININE: 0.67 mg/dL (ref 0.61–1.24)
GFR calc Af Amer: >60 mL/min (ref >60)
GFR calc non Af Amer: >60 mL/min (ref >60)
Glucose, Bld: 97 mg/dL (ref 65–99)
Potassium: 3.7 mmol/L (ref 3.5–5.1)
SODIUM: 139 mmol/L (ref 135–145)

## 2017-10-14 LAB — HEMOGLOBIN AND HEMATOCRIT, BLOOD
HEMATOCRIT: 28.7 % — AB (ref 39.0–52.0)
Hemoglobin: 10 g/dL — ABNORMAL LOW (ref 13.0–17.0)

## 2017-10-14 LAB — CBC
HCT: 21.5 % — ABNORMAL LOW (ref 39.0–52.0)
Hemoglobin: 7.6 g/dL — ABNORMAL LOW (ref 13.0–17.0)
MCH: 36.7 pg — ABNORMAL HIGH (ref 26.0–34.0)
MCHC: 35.3 g/dL (ref 30.0–36.0)
MCV: 103.9 fL — AB (ref 78.0–100.0)
PLATELETS: 112 10*3/uL — AB (ref 150–400)
RBC: 2.07 MIL/uL — ABNORMAL LOW (ref 4.22–5.81)
RDW: 13.1 % (ref 11.5–15.5)
WBC: 5.4 10*3/uL (ref 4.0–10.5)

## 2017-10-14 LAB — FOLATE: FOLATE: 11.9 ng/mL (ref >5.9)

## 2017-10-14 LAB — HIV ANTIBODY (ROUTINE TESTING W REFLEX): HIV SCREEN 4TH GENERATION: NONREACTIVE

## 2017-10-14 LAB — MRSA PCR SCREENING: MRSA by PCR: NEGATIVE

## 2017-10-14 LAB — FERRITIN: Ferritin: 166 ng/mL (ref 24–336)

## 2017-10-14 LAB — VITAMIN B12: VITAMIN B 12: 123 pg/mL — AB (ref 180–914)

## 2017-10-14 SURGERY — ESOPHAGOGASTRODUODENOSCOPY (EGD) WITH PROPOFOL
Anesthesia: Monitor Anesthesia Care

## 2017-10-14 MED ORDER — IOPAMIDOL (ISOVUE-300) INJECTION 61%
INTRAVENOUS | Status: AC
  Administered 2017-10-14: 35 mL via INTRA_ARTERIAL
  Filled 2017-10-14: qty 200

## 2017-10-14 MED ORDER — PHENYLEPHRINE 40 MCG/ML (10ML) SYRINGE FOR IV PUSH (FOR BLOOD PRESSURE SUPPORT)
PREFILLED_SYRINGE | INTRAVENOUS | Status: DC | PRN
  Administered 2017-10-14: 80 ug via INTRAVENOUS

## 2017-10-14 MED ORDER — LACTATED RINGERS IV SOLN
INTRAVENOUS | Status: DC
  Administered 2017-10-14 (×2): via INTRAVENOUS

## 2017-10-14 MED ORDER — FENTANYL CITRATE (PF) 100 MCG/2ML IJ SOLN
INTRAMUSCULAR | Status: AC | PRN
  Administered 2017-10-14 (×2): 50 ug via INTRAVENOUS

## 2017-10-14 MED ORDER — EPINEPHRINE PF 1 MG/10ML IJ SOSY
PREFILLED_SYRINGE | INTRAMUSCULAR | Status: AC
  Filled 2017-10-14: qty 10

## 2017-10-14 MED ORDER — GLYCOPYRROLATE 0.2 MG/ML IV SOSY
PREFILLED_SYRINGE | INTRAVENOUS | Status: DC | PRN
  Administered 2017-10-14: .2 mg via INTRAVENOUS

## 2017-10-14 MED ORDER — PROPOFOL 500 MG/50ML IV EMUL
INTRAVENOUS | Status: DC | PRN
  Administered 2017-10-14: 200 ug/kg/min via INTRAVENOUS

## 2017-10-14 MED ORDER — CYANOCOBALAMIN 1000 MCG/ML IJ SOLN
1000.0000 ug | Freq: Every day | INTRAMUSCULAR | Status: AC
  Administered 2017-10-14 – 2017-10-16 (×3): 1000 ug via SUBCUTANEOUS
  Filled 2017-10-14 (×3): qty 1

## 2017-10-14 MED ORDER — ACETAMINOPHEN 500 MG PO TABS: 1000.0000 mg | ORAL_TABLET | Freq: Four times a day (QID) | ORAL | Status: DC | PRN

## 2017-10-14 MED ORDER — SODIUM CHLORIDE 0.9 % IV SOLN: INTRAVENOUS | Status: DC

## 2017-10-14 MED ORDER — ONDANSETRON HCL 4 MG/2ML IJ SOLN
INTRAMUSCULAR | Status: DC | PRN
  Administered 2017-10-14: 4 mg via INTRAVENOUS

## 2017-10-14 MED ORDER — LIDOCAINE HCL 1 % IJ SOLN
INTRAMUSCULAR | Status: AC
  Filled 2017-10-14: qty 20

## 2017-10-14 MED ORDER — PROPOFOL 10 MG/ML IV BOLUS
INTRAVENOUS | Status: AC
  Filled 2017-10-14: qty 20

## 2017-10-14 MED ORDER — MIDAZOLAM HCL 2 MG/2ML IJ SOLN
INTRAMUSCULAR | Status: AC
  Filled 2017-10-14: qty 6

## 2017-10-14 MED ORDER — PROPOFOL 10 MG/ML IV BOLUS
INTRAVENOUS | Status: DC | PRN
  Administered 2017-10-14 (×2): 20 mg via INTRAVENOUS

## 2017-10-14 MED ORDER — FENTANYL CITRATE (PF) 100 MCG/2ML IJ SOLN
25.0000 ug | INTRAMUSCULAR | Status: DC | PRN
  Administered 2017-10-14: 50 ug via INTRAVENOUS
  Filled 2017-10-14: qty 2

## 2017-10-14 MED ORDER — PROPOFOL 10 MG/ML IV BOLUS
INTRAVENOUS | Status: AC
  Filled 2017-10-14: qty 40

## 2017-10-14 MED ORDER — SODIUM CHLORIDE 0.9 % IV SOLN: Freq: Once | INTRAVENOUS | Status: DC

## 2017-10-14 MED ORDER — HYDROCODONE-ACETAMINOPHEN 10-325 MG PO TABS: 1.0000 | ORAL_TABLET | ORAL | Status: DC | PRN

## 2017-10-14 MED ORDER — SODIUM CHLORIDE 0.9 % IJ SOLN
PREFILLED_SYRINGE | INTRAMUSCULAR | Status: DC | PRN
  Administered 2017-10-14: 2 mL

## 2017-10-14 MED ORDER — ACETAMINOPHEN 650 MG RE SUPP: 650.0000 mg | Freq: Four times a day (QID) | RECTAL | Status: DC | PRN

## 2017-10-14 MED ORDER — MIDAZOLAM HCL 2 MG/2ML IJ SOLN
INTRAMUSCULAR | Status: AC | PRN
  Administered 2017-10-14 (×4): 1 mg via INTRAVENOUS

## 2017-10-14 MED ORDER — LIDOCAINE HCL (PF) 1 % IJ SOLN
INTRAMUSCULAR | Status: AC | PRN
  Administered 2017-10-14: 5 mL

## 2017-10-14 MED ORDER — TRAMADOL HCL 50 MG PO TABS: 50.0000 mg | ORAL_TABLET | Freq: Four times a day (QID) | ORAL | Status: DC | PRN

## 2017-10-14 MED ORDER — POTASSIUM CHLORIDE IN NACL 20-0.9 MEQ/L-% IV SOLN
INTRAVENOUS | Status: DC
  Administered 2017-10-14 – 2017-10-15 (×3): via INTRAVENOUS
  Filled 2017-10-14 (×4): qty 1000

## 2017-10-14 MED ORDER — LIDOCAINE 2% (20 MG/ML) 5 ML SYRINGE
INTRAMUSCULAR | Status: DC | PRN
  Administered 2017-10-14: 100 mg via INTRAVENOUS

## 2017-10-14 MED ORDER — ZOLPIDEM TARTRATE 5 MG PO TABS
5.0000 mg | ORAL_TABLET | Freq: Every day | ORAL | Status: DC
  Administered 2017-10-14 – 2017-10-15 (×2): 5 mg via ORAL
  Filled 2017-10-14 (×2): qty 1

## 2017-10-14 MED ORDER — FENTANYL CITRATE (PF) 100 MCG/2ML IJ SOLN
INTRAMUSCULAR | Status: AC
  Filled 2017-10-14: qty 4

## 2017-10-14 MED ORDER — IOPAMIDOL (ISOVUE-300) INJECTION 61%
100.0000 mL | Freq: Once | INTRAVENOUS | Status: AC | PRN
  Administered 2017-10-14: 35 mL via INTRA_ARTERIAL

## 2017-10-14 SURGICAL SUPPLY — 15 items

## 2017-10-14 NOTE — Brief Op Note (Signed)
10/13/2017 - 10/14/2017  9:10 AM  PATIENT:  Kenneth Robbins  62 y.o. male  PRE-OPERATIVE DIAGNOSIS:  Upper GI Bleed  POST-OPERATIVE DIAGNOSIS:  bleeding duodenal ulcer, bicap and injected epi  PROCEDURE:  Procedure(s): ESOPHAGOGASTRODUODENOSCOPY (EGD) WITH PROPOFOL (N/A)  SURGEON:  Surgeon(s) and Role:    * Rylei Masella, MD - Primary  Findings ----------- - Active arterial bleeding at duodenal sweep. Treated with epinephrine injection and BiCAP gold probe cautery.bleeding slowed down but not completely stopped. - Abnormal mucosa with thick fold at GE junction. Biopsy was not performed.  Recommendations ------------------------- - Interventional radiology consult for embolization of gastroduodenal artery. - monitor H&H. Transfuse as needed. - Recommend lifelong once a day PPI. - Repeat EGD in 2 months - Case discussed with interventional radiology. Discussed with wife at bedside. - Keep nothing by mouth for now.  Kathi Der MD, FACP 10/14/2017, 9:11 AM  Contact #  [phone]

## 2017-10-14 NOTE — Consult Note (Signed)
Chief Complaint: Patient was seen in consultation today for visceral/mesenteric arteriogram with possible embolization Chief Complaint  Patient presents with  . Blood In Stools  . Hematemesis    Referring Physician(s): Brahmbhatt,P  Supervising Physician: Oley Balm  Patient Status: [facility] - In-pt  History of Present Illness: Kenneth Robbins is a 62 y.o. male smoker with prior history of Naprosyn and Goody powder use secondary to arthritis who presented to [facility] on 5/5 with bloody vomitus/stools/lightheadedness.  He has no prior history of bleeding or other chronic GI issues.  EGD this morning revealed active arterial bleeding from duodenal ulcer.  Patient was treated with epinephrine injection and BiCAP gold probe cautery with slowing but not eradication of bleeding.  Also noted to have abnormal mucosa with thick fold at  GE junction.  Biopsy not performed.  Request now received for emergent visceral arteriogram with possible embolization due to continued bleeding.  Past Medical History:  Diagnosis Date  . Erectile dysfunction   . Hx of adenomatous polyp of colon   . Osteoarthritis   . Seasonal allergies     Past Surgical History:  Procedure Laterality Date  . APPENDECTOMY    . COLONOSCOPY    . KNEE ARTHROSCOPY Bilateral     Allergies: Patient has no known allergies.  Medications: Prior to Admission medications   Medication Sig Start Date End Date Taking? Authorizing Provider  Aspirin-Caffeine (BC FAST PAIN RELIEF PO) Take 1-2 packets by mouth daily as needed (hand pain).   Yes [provider]  diphenhydrAMINE (BENADRYL) 25 MG tablet Take 1-2 tablets (25-50 mg total) by mouth every 6 (six) hours as needed for itching or allergies. 04/04/14  Yes Pricilla Loveless, MD  fluticasone (FLONASE) 50 MCG/ACT nasal spray Place 1 spray into both nostrils daily.   Yes [provider]  Multiple Vitamin (MULTIVITAMIN WITH MINERALS) TABS tablet Take 1  tablet by mouth daily.   Yes [provider]  naproxen (NAPROSYN) 500 MG tablet Take 500 mg by mouth 2 (two) times daily as needed for mild pain.   Yes [provider]  VIAGRA 100 MG tablet Take 1 tablet by mouth as needed. Before sexual intercourse 02/04/14  Yes [provider]     Family History  Problem Relation Age of Onset  . Cancer Mother   . Heart failure Father     Social History   Socioeconomic History  . Marital status: Married    Spouse name: Not on file  . Number of children: Not on file  . Years of education: Not on file  . Highest education level: Not on file  Occupational History    Employer: [employer]  Social Needs  . Financial resource strain: Not on file  . Food insecurity:    Worry: Not on file    Inability: Not on file  . Transportation needs:    Medical: Not on file    Non-medical: Not on file  Tobacco Use  . Smoking status: Light Tobacco Smoker    Types: Cigars  . Smokeless tobacco: Never Used  Substance and Sexual Activity  . Alcohol use: Yes    Comment: 8 drinks per week  . Drug use: Yes    Types: Marijuana  . Sexual activity: Yes  Lifestyle  . Physical activity:    Days per week: Not on file    Minutes per session: Not on file  . Stress: Not on file  Relationships  . Social connections:  Talks on phone: Not on file    Gets together: Not on file    Attends religious service: Not on file    Active member of club or organization: Not on file    Attends meetings of clubs or organizations: Not on file    Relationship status: Not on file  Other Topics Concern  . 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  Currently having chills.  Denies fever, headache, respiratory difficulties, back pain, nausea, vomiting.  Does  have abdominal discomfort  Vital Signs: BP 127/64   Pulse 91   Temp 98.3 F (36.8 C) (Oral)   Resp 16   Ht  (1.753 m)   Wt 162 lb (73.5 kg)   SpO2 100%   BMI 23.92 kg/m   Physical Exam patient with rigors; drowsy but arousable.  Chest clear to auscultation bilaterally.  Heart with regular rate and rhythm.  Abdomen soft, positive bowel sounds, epigastric tenderness to palpation.  No lower extremity edema, intact distal pulses.  Imaging: No results found.  Labs:  CBC: Recent Labs    10/13/17 1252 10/13/17 2119 10/14/17 0310  WBC 10.4 8.0 5.4  HGB 14.1 10.7* 7.6*  HCT 40.8 31.0* 21.5*  PLT 238 162 112*    COAGS: Recent Labs    10/13/17 1252  INR 1.03    BMP: Recent Labs    10/13/17 1252 10/14/17 0310  NA 138 139  K 4.1 3.7  CL 104 117*  CO2 22 18*  GLUCOSE 150* 97  BUN 29* 24*  CALCIUM 9.6 6.5*  CREATININE 1.00 0.67  GFRNONAA >60 >60  GFRAA >60 >60    LIVER FUNCTION TESTS: Recent Labs    10/13/17 1252  BILITOT 1.5*  AST 29  ALT 33  ALKPHOS 57  PROT 7.2  ALBUMIN 4.1    TUMOR MARKERS: No results for input(s): AFPTM, CEA, CA199, CHROMGRNA in the last 8760 hours.  Assessment and Plan: 63 y.o. male smoker with prior history of Naprosyn and Goody powder use secondary to arthritis who presented to [facility] on 5/5 with bloody vomitus/stools/lightheadedness.  He has no prior history of bleeding or other chronic GI issues.  EGD this morning revealed active arterial bleeding from duodenal ulcer.  Patient was treated with epinephrine injection and BiCAP gold probe cautery with slowing but not eradication of bleeding.  Also noted to have abnormal mucosa with thick fold at  GE junction.  Biopsy not performed.  Request now received for emergent visceral arteriogram with possible embolization due to continued bleeding.Risks and benefits of procedure were discussed with the patient/spouse including, but not limited to bleeding, infection, vascular  injury or contrast induced renal failure.  This interventional procedure involves the use of X-rays and because of the nature of the planned procedure, it is possible that we will have prolonged use of X-ray fluoroscopy.  Potential radiation risks to you include (but are not limited to) the following: - A slightly elevated risk for cancer  several years later in life. This risk is typically less than 0.5% percent. This risk is low in comparison to the normal incidence of human cancer, which is 33% for women and 50% for men according to the [employer]. - Radiation induced injury can include skin redness, resembling  a rash, tissue breakdown / ulcers and hair loss (which can be temporary or permanent).   The likelihood of either of these occurring depends on the difficulty of the procedure and whether you are sensitive to radiation due to previous procedures, disease, or genetic conditions.   IF your procedure requires a prolonged use of radiation, you will be notified and given written instructions for further action.  It is your responsibility to monitor the irradiated area for the 2 weeks following the procedure and to notify your physician if you are concerned that you have suffered a radiation induced injury.    All of the patient's questions were answered, patient is agreeable to proceed.  Consent signed and in chart.  Latest hgb 7.6, plts 112K, creat 0.67, PT 13.4/INR 1.03    Thank you for this interesting consult.  I greatly enjoyed meeting Kenneth Robbins and look forward to participating in their care.  A copy of this report was sent to the requesting provider on this date.  Electronically Signed: D. Jeananne Rama, PA-C 10/14/2017, 9:26 AM   I spent a total of 25 minutes   in face to face in clinical consultation, greater than 50% of which was counseling/coordinating care for visceral/mesenteric arteriogram with possible embolization

## 2017-10-14 NOTE — Significant Event (Signed)
RN informed HGB this AM has trended down since presentation yesterday: 14.1, 10.7, now 7.6 so patient has lost 6.5gms HGB in 15 hours.  Will put in for 2u PRBC transfusion now!

## 2017-10-14 NOTE — Progress Notes (Signed)
PROGRESS NOTE    Kenneth Robbins:[id number]  DOB: 08/08/1955  DOA: 10/13/2017 PCP: Catha Gosselin, MD   Brief Narrative:  Kenneth Robbins  is a 62 y.o. male with medical history of arthritis who is prescribed naproxen but mainly takes 1 to 2 packs of Goody powders a day.  About 2 days ago he began having bloody vomitus and bloody stools and subsequently became lightheaded.  His last episode of bloody stool was this morning.  Prior to this he was not having any nausea, loss of appetite, abdominal pain or weight loss.  He also mentions that he drinks at least 2 glasses of wine every evening and sometimes other liquor.  He has no history of gastric ulcers or reflux.  He had a colonoscopy a couple of years ago and only a small polyp was found he states.  Subjective:  he had mid abdominal cramping pain earlier but this has resolved. He was having back pain while he had to lay flat and Fentanyl helped with this. Now that he is able to sit up, he is not having pain. No nausea. No more BMs.     Assessment & Plan:   Principal Problem:   Upper GI bleed - has had EGD and subsequent IR embolization of Gastroduodenal arther - had 2 U PRBC ordered and has received 1 so far- BP stable, no further BMs - will be receiving 1 U PRBC now and then will have CBC 2 hr later. - BP and HR stable. Awaiting SDU bed- still in ED - clear liquids    Active Problems:  B12 deficiency - start B12  s/c   Arthritis - placed on  Voltaren gel  DVT prophylaxis: SCDs Code Status: Full code Family Communication: wife Disposition Plan: follow Hb and follow for further bleeding Consultants:   GI  IR Procedures:   EGD  Gastroduodenal artery coil embolization     Antimicrobials:  Anti-infectives (From admission, onward)   None       Objective: Vitals:   10/14/17 1230 10/14/17 1300 10/14/17 1330 10/14/17 1430  BP: 113/75 118/73 114/77 124/79  Pulse: 76 93 97 96  Resp:    17  Temp:      TempSrc:        SpO2: 100% 98% 97% 98%  Weight:      Height:        Intake/Output Summary (Last 24 hours) at 10/14/2017 1531 Last data filed at 10/14/2017 0855 Gross per 24 hour  Intake 2340 ml  Output -  Net 2340 ml   Filed Weights   10/13/17 1233 10/14/17 0730  Weight: 73.5 kg (162 lb) 73.5 kg (162 lb)    Examination: General exam: Appears comfortable  HEENT: PERRLA, oral mucosa moist, no sclera icterus or thrush Respiratory system: Clear to auscultation. Respiratory effort normal. Cardiovascular system: S1 & S2 heard, RRR.   Gastrointestinal system: Abdomen soft, non-tender, nondistended. Normal bowel sound. No organomegaly Central nervous system: Alert and oriented. No focal neurological deficits. Extremities: No cyanosis, clubbing or edema Skin: No rashes or ulcers Psychiatry:  Mood & affect appropriate.     Data Reviewed: I have personally reviewed following labs and imaging studies  CBC: Recent Labs  Lab 10/13/17 1252 10/13/17 2119 10/14/17 0310  WBC 10.4 8.0 5.4  HGB 14.1 10.7* 7.6*  HCT 40.8 31.0* 21.5*  MCV 103.3* 103.3* 103.9*  PLT 238 162 112*   Basic Metabolic Panel: Recent Labs  Lab 10/13/17 1252 10/14/17 0310  NA  138 139  K 4.1 3.7  CL 104 117*  CO2 22 18*  GLUCOSE 150* 97  BUN 29* 24*  CREATININE 1.00 0.67  CALCIUM 9.6 6.5*   GFR: Estimated Creatinine Clearance: 94.5 mL/min (by C-G formula based on SCr of 0.67 mg/dL). Liver Function Tests: Recent Labs  Lab 10/13/17 1252  AST 29  ALT 33  ALKPHOS 57  BILITOT 1.5*  PROT 7.2  ALBUMIN 4.1   No results for input(s): LIPASE, AMYLASE in the last 168 hours. No results for input(s): AMMONIA in the last 168 hours. Coagulation Profile: Recent Labs  Lab 10/13/17 1252  INR 1.03   Cardiac Enzymes: No results for input(s): CKTOTAL, CKMB, CKMBINDEX, TROPONINI in the last 168 hours. BNP (last 3 results) No results for input(s): PROBNP in the last 8760 hours. HbA1C: No results for input(s): HGBA1C in the  last 72 hours. CBG: No results for input(s): GLUCAP in the last 168 hours. Lipid Profile: No results for input(s): CHOL, HDL, LDLCALC, TRIG, CHOLHDL, LDLDIRECT in the last 72 hours. Thyroid Function Tests: No results for input(s): TSH, T4TOTAL, FREET4, T3FREE, THYROIDAB in the last 72 hours. Anemia Panel: Recent Labs    10/13/17 2119  VITAMINB12 123*  FOLATE 11.9  FERRITIN 166  TIBC 284  IRON 107  RETICCTPCT 2.9   Urine analysis: No results found for: COLORURINE, APPEARANCEUR, LABSPEC, PHURINE, GLUCOSEU, HGBUR, BILIRUBINUR, KETONESUR, PROTEINUR, UROBILINOGEN, NITRITE, LEUKOCYTESUR Sepsis Labs: (procalcitonin:4,lacticidven:4) )No results found for this or any previous visit (from the past 240 hour(s)).       Radiology Studies: Ir Angiogram Visceral Selective  Result Date: 10/14/2017 CLINICAL DATA:  Upper GI bleed. Acute duodenal bleeding noted on endoscopy, incompletely controlled. Urgent embolization is requested. EXAM: IR EMBO ART VEN HEMORR LYMPH EXTRAV INC GUIDE ROADMAPPING; ADDITIONAL ARTERIOGRAPHY; SELECTIVE VISCERAL ARTERIOGRAPHY; IR ULTRASOUND GUIDANCE VASC ACCESS RIGHT ANESTHESIA/SEDATION: Intravenous Fentanyl and Versed were administered as conscious sedation during continuous monitoring of the patient's level of consciousness and physiological / cardiorespiratory status by the radiology RN, with a total moderate sedation time of 48 minutes. MEDICATIONS: Lidocaine 1% subcutaneous CONTRAST:  Isovue-300   IA PROCEDURE: The procedure, risks (including but not limited to bleeding, infection, organ damage ), benefits, and alternatives were explained to the patient and spouse. Questions regarding the procedure were encouraged and answered. As the patient remains partially sedated from recent endoscopy, the spouse understands and consents to the procedure. Right femoral region prepped and draped in usual sterile fashion. Maximal barrier sterile technique was utilized  including caps, mask, sterile gowns, sterile gloves, sterile drape, hand hygiene and skin antiseptic. The right common femoral artery was localized under ultrasound, patency confirmed and documentation stored. Under real-time ultrasound guidance, the vessel was accessed with a 21-gauge micropuncture needle, exchanged over a 018 guidewire for a transitional dilator, through which a 035 guidewire was advanced. Over this, a 5 [place] vascular sheath was placed, through which a 5 [place] C2 catheter was advanced and used to selectively catheterize the superior mesenteric artery for selective arteriography. The C2 was then utilized to selectively catheterize the celiac axis for selective arteriography. A coaxial microcatheter was advanced into the common hepatic artery for sub selective arteriography. The microcatheter was then advanced into the gastroduodenal artery for coil embolization using 4 and 5 mm detachable coils. Final arteriogram from the common hepatic artery was obtained. The catheter and sheath were removed and hemostasis achieved with the aid of the Exoseal device after confirmatory femoral arteriography. The patient tolerated the procedure well. COMPLICATIONS: None  immediate FINDINGS: The superior mesenteric artery is widely patent. No evidence of active extravasation. Replaced right hepatic arterial supply from the SMA, an anatomic variant. No retrograde flow through the gastroduodenal artery. Selective celiac celiac arteriography demonstrates wide patency of the splenic artery, left gastric artery, and common hepatic artery. Left hepatic arterial supply arises from the left gastric artery and there is a small hepatic artery arising from the proper hepatic artery beyond the origin of the GDA. No active extravasation was identified from selective common hepatic or gastroduodenal arteriography. Given the precise anatomic localization provided on recent endoscopy, empiric coil embolization of the  gastroduodenal artery was performed as above. Final common hepatic arteriogram demonstrates technically successful occlusion of the GDA. The proper hepatic artery remains widely patent. IMPRESSION: 1. No active extravasation from superior mesenteric, celiac axis, common hepatic or gastroduodenal arteriography. 2. Technically successful empiric coil embolization of gastroduodenal artery. 3. Right hepatic arterial supply from the SMA, and left hepatic arterial supply from the left gastric artery, anatomic normal variants. Electronically Signed   By: Corlis Leak M.D.   On: 10/14/2017 14:32   Ir Angiogram Visceral Selective  Result Date: 10/14/2017 CLINICAL DATA:  Upper GI bleed. Acute duodenal bleeding noted on endoscopy, incompletely controlled. Urgent embolization is requested. EXAM: IR EMBO ART VEN HEMORR LYMPH EXTRAV INC GUIDE ROADMAPPING; ADDITIONAL ARTERIOGRAPHY; SELECTIVE VISCERAL ARTERIOGRAPHY; IR ULTRASOUND GUIDANCE VASC ACCESS RIGHT ANESTHESIA/SEDATION: Intravenous Fentanyl and Versed were administered as conscious sedation during continuous monitoring of the patient's level of consciousness and physiological / cardiorespiratory status by the radiology RN, with a total moderate sedation time of 48 minutes. MEDICATIONS: Lidocaine 1% subcutaneous CONTRAST:  Isovue-300   IA PROCEDURE: The procedure, risks (including but not limited to bleeding, infection, organ damage ), benefits, and alternatives were explained to the patient and spouse. Questions regarding the procedure were encouraged and answered. As the patient remains partially sedated from recent endoscopy, the spouse understands and consents to the procedure. Right femoral region prepped and draped in usual sterile fashion. Maximal barrier sterile technique was utilized including caps, mask, sterile gowns, sterile gloves, sterile drape, hand hygiene and skin antiseptic. The right common femoral artery was localized under ultrasound, patency confirmed  and documentation stored. Under real-time ultrasound guidance, the vessel was accessed with a 21-gauge micropuncture needle, exchanged over a 018 guidewire for a transitional dilator, through which a 035 guidewire was advanced. Over this, a 5 [place] vascular sheath was placed, through which a 5 [place] C2 catheter was advanced and used to selectively catheterize the superior mesenteric artery for selective arteriography. The C2 was then utilized to selectively catheterize the celiac axis for selective arteriography. A coaxial microcatheter was advanced into the common hepatic artery for sub selective arteriography. The microcatheter was then advanced into the gastroduodenal artery for coil embolization using 4 and 5 mm detachable coils. Final arteriogram from the common hepatic artery was obtained. The catheter and sheath were removed and hemostasis achieved with the aid of the Exoseal device after confirmatory femoral arteriography. The patient tolerated the procedure well. COMPLICATIONS: None immediate FINDINGS: The superior mesenteric artery is widely patent. No evidence of active extravasation. Replaced right hepatic arterial supply from the SMA, an anatomic variant. No retrograde flow through the gastroduodenal artery. Selective celiac celiac arteriography demonstrates wide patency of the splenic artery, left gastric artery, and common hepatic artery. Left hepatic arterial supply arises from the left gastric artery and there is a small hepatic artery arising from the proper hepatic artery beyond the origin of  the GDA. No active extravasation was identified from selective common hepatic or gastroduodenal arteriography. Given the precise anatomic localization provided on recent endoscopy, empiric coil embolization of the gastroduodenal artery was performed as above. Final common hepatic arteriogram demonstrates technically successful occlusion of the GDA. The proper hepatic artery remains widely patent.  IMPRESSION: 1. No active extravasation from superior mesenteric, celiac axis, common hepatic or gastroduodenal arteriography. 2. Technically successful empiric coil embolization of gastroduodenal artery. 3. Right hepatic arterial supply from the SMA, and left hepatic arterial supply from the left gastric artery, anatomic normal variants. Electronically Signed   By: Corlis Leak M.D.   On: 10/14/2017 14:32   Ir Angiogram Selective Each Additional Vessel  Result Date: 10/14/2017 CLINICAL DATA:  Upper GI bleed. Acute duodenal bleeding noted on endoscopy, incompletely controlled. Urgent embolization is requested. EXAM: IR EMBO ART VEN HEMORR LYMPH EXTRAV INC GUIDE ROADMAPPING; ADDITIONAL ARTERIOGRAPHY; SELECTIVE VISCERAL ARTERIOGRAPHY; IR ULTRASOUND GUIDANCE VASC ACCESS RIGHT ANESTHESIA/SEDATION: Intravenous Fentanyl and Versed were administered as conscious sedation during continuous monitoring of the patient's level of consciousness and physiological / cardiorespiratory status by the radiology RN, with a total moderate sedation time of 48 minutes. MEDICATIONS: Lidocaine 1% subcutaneous CONTRAST:  Isovue-300   IA PROCEDURE: The procedure, risks (including but not limited to bleeding, infection, organ damage ), benefits, and alternatives were explained to the patient and spouse. Questions regarding the procedure were encouraged and answered. As the patient remains partially sedated from recent endoscopy, the spouse understands and consents to the procedure. Right femoral region prepped and draped in usual sterile fashion. Maximal barrier sterile technique was utilized including caps, mask, sterile gowns, sterile gloves, sterile drape, hand hygiene and skin antiseptic. The right common femoral artery was localized under ultrasound, patency confirmed and documentation stored. Under real-time ultrasound guidance, the vessel was accessed with a 21-gauge micropuncture needle, exchanged over a 018 guidewire for a  transitional dilator, through which a 035 guidewire was advanced. Over this, a 5 [place] vascular sheath was placed, through which a 5 [place] C2 catheter was advanced and used to selectively catheterize the superior mesenteric artery for selective arteriography. The C2 was then utilized to selectively catheterize the celiac axis for selective arteriography. A coaxial microcatheter was advanced into the common hepatic artery for sub selective arteriography. The microcatheter was then advanced into the gastroduodenal artery for coil embolization using 4 and 5 mm detachable coils. Final arteriogram from the common hepatic artery was obtained. The catheter and sheath were removed and hemostasis achieved with the aid of the Exoseal device after confirmatory femoral arteriography. The patient tolerated the procedure well. COMPLICATIONS: None immediate FINDINGS: The superior mesenteric artery is widely patent. No evidence of active extravasation. Replaced right hepatic arterial supply from the SMA, an anatomic variant. No retrograde flow through the gastroduodenal artery. Selective celiac celiac arteriography demonstrates wide patency of the splenic artery, left gastric artery, and common hepatic artery. Left hepatic arterial supply arises from the left gastric artery and there is a small hepatic artery arising from the proper hepatic artery beyond the origin of the GDA. No active extravasation was identified from selective common hepatic or gastroduodenal arteriography. Given the precise anatomic localization provided on recent endoscopy, empiric coil embolization of the gastroduodenal artery was performed as above. Final common hepatic arteriogram demonstrates technically successful occlusion of the GDA. The proper hepatic artery remains widely patent. IMPRESSION: 1. No active extravasation from superior mesenteric, celiac axis, common hepatic or gastroduodenal arteriography. 2. Technically successful empiric coil  embolization of  gastroduodenal artery. 3. Right hepatic arterial supply from the SMA, and left hepatic arterial supply from the left gastric artery, anatomic normal variants. Electronically Signed   By: Corlis Leak M.D.   On: 10/14/2017 14:32   Ir US Guide Vasc Access Right  Result Date: 10/14/2017 CLINICAL DATA:  Upper GI bleed. Acute duodenal bleeding noted on endoscopy, incompletely controlled. Urgent embolization is requested. EXAM: IR EMBO ART VEN HEMORR LYMPH EXTRAV INC GUIDE ROADMAPPING; ADDITIONAL ARTERIOGRAPHY; SELECTIVE VISCERAL ARTERIOGRAPHY; IR ULTRASOUND GUIDANCE VASC ACCESS RIGHT ANESTHESIA/SEDATION: Intravenous Fentanyl and Versed were administered as conscious sedation during continuous monitoring of the patient's level of consciousness and physiological / cardiorespiratory status by the radiology RN, with a total moderate sedation time of 48 minutes. MEDICATIONS: Lidocaine 1% subcutaneous CONTRAST:  Isovue-300   IA PROCEDURE: The procedure, risks (including but not limited to bleeding, infection, organ damage ), benefits, and alternatives were explained to the patient and spouse. Questions regarding the procedure were encouraged and answered. As the patient remains partially sedated from recent endoscopy, the spouse understands and consents to the procedure. Right femoral region prepped and draped in usual sterile fashion. Maximal barrier sterile technique was utilized including caps, mask, sterile gowns, sterile gloves, sterile drape, hand hygiene and skin antiseptic. The right common femoral artery was localized under ultrasound, patency confirmed and documentation stored. Under real-time ultrasound guidance, the vessel was accessed with a 21-gauge micropuncture needle, exchanged over a 018 guidewire for a transitional dilator, through which a 035 guidewire was advanced. Over this, a 5 [place] vascular sheath was placed, through which a 5 [place] C2 catheter was advanced and used to selectively  catheterize the superior mesenteric artery for selective arteriography. The C2 was then utilized to selectively catheterize the celiac axis for selective arteriography. A coaxial microcatheter was advanced into the common hepatic artery for sub selective arteriography. The microcatheter was then advanced into the gastroduodenal artery for coil embolization using 4 and 5 mm detachable coils. Final arteriogram from the common hepatic artery was obtained. The catheter and sheath were removed and hemostasis achieved with the aid of the Exoseal device after confirmatory femoral arteriography. The patient tolerated the procedure well. COMPLICATIONS: None immediate FINDINGS: The superior mesenteric artery is widely patent. No evidence of active extravasation. Replaced right hepatic arterial supply from the SMA, an anatomic variant. No retrograde flow through the gastroduodenal artery. Selective celiac celiac arteriography demonstrates wide patency of the splenic artery, left gastric artery, and common hepatic artery. Left hepatic arterial supply arises from the left gastric artery and there is a small hepatic artery arising from the proper hepatic artery beyond the origin of the GDA. No active extravasation was identified from selective common hepatic or gastroduodenal arteriography. Given the precise anatomic localization provided on recent endoscopy, empiric coil embolization of the gastroduodenal artery was performed as above. Final common hepatic arteriogram demonstrates technically successful occlusion of the GDA. The proper hepatic artery remains widely patent. IMPRESSION: 1. No active extravasation from superior mesenteric, celiac axis, common hepatic or gastroduodenal arteriography. 2. Technically successful empiric coil embolization of gastroduodenal artery. 3. Right hepatic arterial supply from the SMA, and left hepatic arterial supply from the left gastric artery, anatomic normal variants. Electronically Signed    By: Corlis Leak M.D.   On: 10/14/2017 14:32   Ir Embo Art  [facility] Hem[facility] Lymph [place] [place]  Inc Guide Roadmapping  Result Date: 10/14/2017 CLINICAL DATA:  Upper GI bleed. Acute duodenal bleeding noted on endoscopy, incompletely controlled. Urgent embolization is requested. EXAM: IR  EMBO ART VEN HEMORR LYMPH EXTRAV INC GUIDE ROADMAPPING; ADDITIONAL ARTERIOGRAPHY; SELECTIVE VISCERAL ARTERIOGRAPHY; IR ULTRASOUND GUIDANCE VASC ACCESS RIGHT ANESTHESIA/SEDATION: Intravenous Fentanyl and Versed were administered as conscious sedation during continuous monitoring of the patient's level of consciousness and physiological / cardiorespiratory status by the radiology RN, with a total moderate sedation time of 48 minutes. MEDICATIONS: Lidocaine 1% subcutaneous CONTRAST:  Isovue-300   IA PROCEDURE: The procedure, risks (including but not limited to bleeding, infection, organ damage ), benefits, and alternatives were explained to the patient and spouse. Questions regarding the procedure were encouraged and answered. As the patient remains partially sedated from recent endoscopy, the spouse understands and consents to the procedure. Right femoral region prepped and draped in usual sterile fashion. Maximal barrier sterile technique was utilized including caps, mask, sterile gowns, sterile gloves, sterile drape, hand hygiene and skin antiseptic. The right common femoral artery was localized under ultrasound, patency confirmed and documentation stored. Under real-time ultrasound guidance, the vessel was accessed with a 21-gauge micropuncture needle, exchanged over a 018 guidewire for a transitional dilator, through which a 035 guidewire was advanced. Over this, a 5 [place] vascular sheath was placed, through which a 5 [place] C2 catheter was advanced and used to selectively catheterize the superior mesenteric artery for selective arteriography. The C2 was then utilized to selectively catheterize the celiac axis for selective arteriography. A  coaxial microcatheter was advanced into the common hepatic artery for sub selective arteriography. The microcatheter was then advanced into the gastroduodenal artery for coil embolization using 4 and 5 mm detachable coils. Final arteriogram from the common hepatic artery was obtained. The catheter and sheath were removed and hemostasis achieved with the aid of the Exoseal device after confirmatory femoral arteriography. The patient tolerated the procedure well. COMPLICATIONS: None immediate FINDINGS: The superior mesenteric artery is widely patent. No evidence of active extravasation. Replaced right hepatic arterial supply from the SMA, an anatomic variant. No retrograde flow through the gastroduodenal artery. Selective celiac celiac arteriography demonstrates wide patency of the splenic artery, left gastric artery, and common hepatic artery. Left hepatic arterial supply arises from the left gastric artery and there is a small hepatic artery arising from the proper hepatic artery beyond the origin of the GDA. No active extravasation was identified from selective common hepatic or gastroduodenal arteriography. Given the precise anatomic localization provided on recent endoscopy, empiric coil embolization of the gastroduodenal artery was performed as above. Final common hepatic arteriogram demonstrates technically successful occlusion of the GDA. The proper hepatic artery remains widely patent. IMPRESSION: 1. No active extravasation from superior mesenteric, celiac axis, common hepatic or gastroduodenal arteriography. 2. Technically successful empiric coil embolization of gastroduodenal artery. 3. Right hepatic arterial supply from the SMA, and left hepatic arterial supply from the left gastric artery, anatomic normal variants. Electronically Signed   By: Corlis Leak M.D.   On: 10/14/2017 14:32      Scheduled Meds: . diclofenac sodium  2 g Topical QID  . fentaNYL      . lidocaine      . midazolam      .  [START ON 10/17/2017] pantoprazole  40 mg Intravenous Q12H  . pantoprazole (PROTONIX) IV  40 mg Intravenous Once   Continuous Infusions: . sodium chloride    . 0.9 % NaCl with KCl 20 mEq / L 100 mL/hr at 10/14/17 0309  . pantoprozole (PROTONIX) infusion 8 mg/hr (10/14/17 0706)     LOS: 1 day    Time spent in minutes: 35  Calvert Cantor, MD [facility] Pager: [email or web address] Password [email or web address] 10/14/2017, 3:31 PM

## 2017-10-14 NOTE — Procedures (Signed)
  Procedure: 2-vessel mesenteric angio; Gastroduodenal artery coil embolization    EBL:   minimal Complications:  none immediate  See full dictation in [employer].  Thora Lance MD Main # [phone] Pager  [phone]

## 2017-10-14 NOTE — ED Notes (Signed)
Pt returned from IR.

## 2017-10-14 NOTE — Anesthesia Postprocedure Evaluation (Signed)
Anesthesia Post Note  Patient: Kenneth Robbins  Procedure(s) Performed: ESOPHAGOGASTRODUODENOSCOPY (EGD) WITH PROPOFOL (N/A )     Patient location during evaluation: PACU Anesthesia Type: MAC Level of consciousness: awake and alert Pain management: pain level controlled Vital Signs Assessment: post-procedure vital signs reviewed and stable Respiratory status: spontaneous breathing, nonlabored ventilation, respiratory function stable and patient connected to nasal cannula oxygen Cardiovascular status: stable and blood pressure returned to baseline Postop Assessment: no apparent nausea or vomiting Anesthetic complications: no    Last Vitals:  Vitals:   10/14/17 1015 10/14/17 1040  BP: (!) 152/76 137/66  Pulse: 90 92  Resp: 18   Temp:    SpO2: 98% 97%    Last Pain:  Vitals:   10/14/17 0901  TempSrc: Oral  PainSc: 5                  Ida Uppal COKER

## 2017-10-14 NOTE — Progress Notes (Signed)
Dr. Levora Angel talked with patient and wife. Pt to go to IR to help stop bleeding. Pt awake and in some pain due to epinephrine injection. Dr. Levora Angel aware. Candy Sledge RN to bedside report given. Transported to IR by Candy Sledge RN. Pt stable VSS.

## 2017-10-14 NOTE — ED Notes (Signed)
Pt wife states that he packs are helping with pain but still wanting pain medications.  Gave more heat packs to change out as needed.

## 2017-10-14 NOTE — ED Notes (Signed)
Sent message in Amion to admitting MD for pain medication orders as pt requesting something for his back pain.   Made pt and wife aware that only tylenol ordered at this time and I have messaged admitting MD for order for stronger pain medications and once I got orders I would be glad to administer it.

## 2017-10-14 NOTE — ED Notes (Addendum)
Wife comes out to desk upset that her husband has nothing going on with him and wants to know what she waiting on and when is a doctor going to come see him. Explained to her that we have no ICU/stepdown beds at this time and that is what we are waiting on.  She states that she called ICU and there is no order for him to come there. Explained to her that he is set for admit on my end. This RN followed with [employer] operational officer about bed placement. Bed placement was called stating that patient is waiting for stepdown but no beds available at this time.  Made wife aware. She is upset that no doctor has come in to see patient and has no fluids running at this time.  This RN went to room, IV fluids going KVO. Explained to patient and wife.  Wanting someone to look at procedure site . This RN looked at dressing which is intact with no bleeding at this time with no redness or s/s of infection.  Pt Wanting to see a doctor.  Informed them I would be glad to page admitting MD.  Massaged Dr Butler Denmark in [facility] to come to see pt per their request.

## 2017-10-14 NOTE — Op Note (Addendum)
[facility] Patient Name: Kenneth Robbins Procedure Date: 10/14/2017 MRN: [id number] Attending MD: Kathi Der , MD Date of Birth: 1955-11-09 CSN: [id number] Age: 62 Admit Type: Outpatient Procedure:                Upper GI endoscopy Indications:              Coffee-ground emesis, Melena Providers:                Kathi Der, MD, Roselie Awkward, RN, Madalyn Rob, Technician, Maricela Curet, CRNA Referring MD:              Medicines:                Sedation Administered by an Anesthesia Professional Complications:            No immediate complications. Estimated Blood Loss:     Estimated blood loss was minimal. Procedure:                Pre-Anesthesia Assessment:                           - Prior to the procedure, a History and Physical                            was performed, and patient medications and                            allergies were reviewed. The patient's tolerance of                            previous anesthesia was also reviewed. The risks                            and benefits of the procedure and the sedation                            options and risks were discussed with the patient.                            All questions were answered, and informed consent                            was obtained. Prior Anticoagulants: The patient has                            taken no previous anticoagulant or antiplatelet                            agents. ASA Grade Assessment: II - A patient with                            mild systemic disease. After reviewing the risks  and benefits, the patient was deemed in                            satisfactory condition to undergo the procedure.                           After obtaining informed consent, the endoscope was                            passed under direct vision. Throughout the                            procedure, the patient's blood pressure,  pulse, and                            oxygen saturations were monitored continuously. The                            Endoscope was introduced through the mouth, and                            advanced to the second part of duodenum. The upper                            GI endoscopy was technically difficult and complex                            due to excessive bleeding. The patient tolerated                            the procedure well. Scope In: Scope Out: Findings:      Localized mucosal changes were found at the gastroesophageal junction.      A 4 cm hiatal hernia was present.      Normal mucosa was found in the entire examined stomach. Biopsies were       taken with a cold forceps for Helicobacter pylori testing.      The cardia and gastric fundus were normal on retroflexion.      One non-obstructing spurting duodenal ulcer with a visible vessel was       found in the first portion of the duodenum. Area was unsuccessfully       injected with 2 mL of a 1:10,000 solution of epinephrine for hemostasis.       Fulguration to stop the bleeding by Gold Probe was unsuccessful. Impression:               - Mucosal changes in the esophagus.                           - 4 cm hiatal hernia.                           - Normal mucosa was found in the entire stomach.                            Biopsied.                           -  One non-obstructing spurting duodenal ulcer with                            a visible vessel. Treatment not successful.                            Treatment not successful. Moderate Sedation:      Moderate (conscious) sedation was personally administered by an       anesthesia professional. The following parameters were monitored: oxygen       saturation, heart rate, blood pressure, and response to care. Recommendation:           - Return patient to hospital ward for ongoing care.                           - NPO.                           - Continue present  medications.                           - Refer to an interventional radiologist today.                           - Repeat upper endoscopy in 2 months to check                            healing.                           - No aspirin, ibuprofen, naproxen, or other                            non-steroidal anti-inflammatory drugs. Procedure Code(s):        --- Professional ---                           [phone], 59, Esophagogastroduodenoscopy, flexible,                            transoral; with control of bleeding, any method                           43239, Esophagogastroduodenoscopy, flexible,                            transoral; with biopsy, single or multiple Diagnosis Code(s):        --- Professional ---                           K22.8, Other specified diseases of esophagus                           K44.9, Diaphragmatic hernia without obstruction or                            gangrene  K26.4, Chronic or unspecified duodenal ulcer with                            hemorrhage                           K92.0, Hematemesis                           K92.1, Melena (includes Hematochezia) CPT copyright 2017 [employer]. All rights reserved. The codes documented in this report are preliminary and upon coder review may  be revised to meet current compliance requirements. Kathi Der, MD Kathi Der, MD 10/14/2017 9:04:06 AM Number of Addenda: 0

## 2017-10-14 NOTE — ED Notes (Signed)
CRITICAL VALUE ALERT  Critical Value:  Hgb 7.6  Date & Time Notied:  10/14/17 0550  Provider Notified: X.Blount  Orders Received/Actions taken: 0554 Transfuse blood

## 2017-10-14 NOTE — Transfer of Care (Signed)
Immediate Anesthesia Transfer of Care Note  Patient: Kenneth Robbins  Procedure(s) Performed: ESOPHAGOGASTRODUODENOSCOPY (EGD) WITH PROPOFOL (N/A )  Patient Location: PACU  Anesthesia Type:MAC  Level of Consciousness: awake, alert  and oriented  Airway & Oxygen Therapy: Patient Spontanous Breathing and Patient connected to nasal cannula oxygen  Post-op Assessment: Report given to RN and Post -op Vital signs reviewed and stable  Post vital signs: Reviewed and stable  Last Vitals:  Vitals Value Taken Time  BP 127/64 10/14/2017  9:00 AM  Temp    Pulse    Resp    SpO2    Vitals shown include unvalidated device data.  Last Pain:  Vitals:   10/14/17 0820  TempSrc: Oral  PainSc:          Complications: No apparent anesthesia complications

## 2017-10-14 NOTE — Interval H&P Note (Signed)
History and Physical Interval Note:  10/14/2017 8:04 AM  Kenneth Robbins  has presented today for surgery, with the diagnosis of Upper GI Bleed  The various methods of treatment have been discussed with the patient and family. After consideration of risks, benefits and other options for treatment, the patient has consented to  Procedure(s): ESOPHAGOGASTRODUODENOSCOPY (EGD) WITH PROPOFOL (N/A) as a surgical intervention .  The patient's history has been reviewed, patient examined, no change in status, stable for surgery.  I have reviewed the patient's chart and labs.  Questions were answered to the patient's satisfaction.     Patient continues to have melena. Drop in hemoglobin noted. Currently getting blood transfusion.  Risks (bleeding, infection, bowel perforation that could require surgery, sedation-related changes in cardiopulmonary systems), benefits (identification and possible treatment of source of symptoms, exclusion of certain causes of symptoms), and alternatives (watchful waiting, radiographic imaging studies, empiric medical treatment)  were explained to patient/family in detail and patient wishes to proceed.  Eitan Doubleday

## 2017-10-14 NOTE — ED Notes (Signed)
Pt given Biotene mouth rinse. Heat pack placed on lower back. Ice pack at bedside to alternate with for back pain until can get pain med orders.

## 2017-10-14 NOTE — ED Notes (Signed)
Bed: [facility]08 Expected date:  Expected time:  Means of arrival:  Comments: Hold ENDO

## 2017-10-14 NOTE — Anesthesia Preprocedure Evaluation (Addendum)
Anesthesia Evaluation  Patient identified by MRN, date of birth, ID band Patient awake    Reviewed: Allergy & Precautions, NPO status , Patient's Chart, lab work & pertinent test results  Airway Mallampati: II  TM Distance: >3 FB Neck ROM: Full    Dental  (+) Edentulous Upper, Partial Lower   Pulmonary Current Smoker,    breath sounds clear to auscultation       Cardiovascular  Rhythm:Regular Rate:Normal     Neuro/Psych    GI/Hepatic   Endo/Other    Renal/GU      Musculoskeletal   Abdominal   Peds  Hematology   Anesthesia Other Findings   Reproductive/Obstetrics                            Anesthesia Physical Anesthesia Plan  ASA: II  Anesthesia Plan: MAC   Post-op Pain Management:    Induction: Intravenous  PONV Risk Score and Plan: Propofol infusion  Airway Management Planned: Simple Face Mask and Natural Airway  Additional Equipment:   Intra-op Plan:   Post-operative Plan:   Informed Consent: I have reviewed the patients History and Physical, chart, labs and discussed the procedure including the risks, benefits and alternatives for the proposed anesthesia with the patient or authorized representative who has indicated his/her understanding and acceptance.   Dental advisory given  Plan Discussed with: CRNA and Anesthesiologist  Anesthesia Plan Comments:         Anesthesia Quick Evaluation

## 2017-10-14 NOTE — ED Notes (Addendum)
Massaged Dr Butler Denmark called back stating she would come see patient when available.

## 2017-10-15 LAB — BASIC METABOLIC PANEL
Anion gap: 6 (ref 5–15)
BUN: 13 mg/dL (ref 6–20)
CHLORIDE: 110 mmol/L (ref 101–111)
CO2: 22 mmol/L (ref 22–32)
Calcium: 7.8 mg/dL — ABNORMAL LOW (ref 8.9–10.3)
Creatinine, Ser: 0.84 mg/dL (ref 0.61–1.24)
GFR calc Af Amer: >60 mL/min (ref >60)
Glucose, Bld: 104 mg/dL — ABNORMAL HIGH (ref 65–99)
POTASSIUM: 3.8 mmol/L (ref 3.5–5.1)
SODIUM: 138 mmol/L (ref 135–145)

## 2017-10-15 LAB — CBC
HEMATOCRIT: 25 % — AB (ref 39.0–52.0)
HEMATOCRIT: 24.9 % — AB (ref 39.0–52.0)
HEMOGLOBIN: 8.8 g/dL — AB (ref 13.0–17.0)
HEMOGLOBIN: 8.8 g/dL — AB (ref 13.0–17.0)
MCH: 34.9 pg — ABNORMAL HIGH (ref 26.0–34.0)
MCH: 35.1 pg — AB (ref 26.0–34.0)
MCHC: 35.2 g/dL (ref 30.0–36.0)
MCHC: 35.3 g/dL (ref 30.0–36.0)
MCV: 99.2 fL (ref 78.0–100.0)
MCV: 99.2 fL (ref 78.0–100.0)
Platelets: 104 10*3/uL — ABNORMAL LOW (ref 150–400)
Platelets: 122 10*3/uL — ABNORMAL LOW (ref 150–400)
RBC: 2.52 MIL/uL — AB (ref 4.22–5.81)
RBC: 2.51 MIL/uL — AB (ref 4.22–5.81)
RDW: 15.1 % (ref 11.5–15.5)
RDW: 15.1 % (ref 11.5–15.5)
WBC: 5.5 10*3/uL (ref 4.0–10.5)
WBC: 6 10*3/uL (ref 4.0–10.5)

## 2017-10-15 LAB — TYPE AND SCREEN
ABO/RH(D): O POS
Antibody Screen: NEGATIVE
Unit division: 0
Unit division: 0

## 2017-10-15 LAB — BPAM RBC
Blood Product Expiration Date: 201906012359
Blood Product Expiration Date: 201906012359
ISSUE DATE / TIME: 201905061533
ISSUE DATE / TIME: 201905060620
UNIT TYPE AND RH: 5100
Unit Type and Rh: 5100

## 2017-10-15 NOTE — Progress Notes (Signed)
PROGRESS NOTE    Kenneth Robbins   ONG:[id number]  DOB: [birth year]  DOA: 10/13/2017 PCP: Catha Gosselin, MD   Brief Narrative:  Kenneth Robbins  is a 62 y.o. male with medical history of arthritis who is prescribed naproxen but mainly takes 1 to 2 packs of Goody powders a day.  About 2 days ago he began having bloody vomitus and bloody stools and subsequently became lightheaded.  His last episode of bloody stool was this morning.  Prior to this he was not having any nausea, loss of appetite, abdominal pain or weight loss.  He also mentions that he drinks at least 2 glasses of wine every evening and sometimes other liquor.  He has no history of gastric ulcers or reflux.  He had a colonoscopy a couple of years ago and only a small polyp was found he states.  Subjective: No abdominal pain. Had some stool that was not very dark or tarry but had black specks and lighter amount of blood. No dizziness. Urinating very well.   Assessment & Plan:   Principal Problem:   Upper GI bleed - had EGD which showed a bleeding duodenal ulcer and subsequent IR embolization of Gastroduodenal artery urgently  -  on clear liquids  - will not advance further until seen by GI - BP, HR stable - needs to avoid NSAIDS and will need a repeat EGD in 2 mo per GI notes  Active Problems:  Acute blood loss anemia - Hb 14.1>> 7.6 - transfused 2 U PRBC>> Hb 10 >> 8.8 - cont to check every 6 hrs  Acute thrombocytopenia - likely consumption- cont to follow  B12 deficiency B12 quite low at 123 - start B12  S/c- he has a h/o B12 deficiency and was receiving s/c injection and then placed on oral replacement- he stopped his oral replacement without discussion with PCP   Arthritis - placed on  Voltaren gel  DVT prophylaxis: SCDs Code Status: Full code Family Communication: wife Disposition Plan: follow Hb and follow for further bleeding Consultants:   GI  IR Procedures:   EGD  Gastroduodenal artery coil embolization      Antimicrobials:  Anti-infectives (From admission, onward)   None       Objective: Vitals:   10/15/17 0600 10/15/17 0700 10/15/17 0800 10/15/17 1000  BP: (!) 115/48 103/61 (!) 137/59 133/64  Pulse:      Resp: (!) Temp:   98.8 F (37.1 C)   TempSrc:   Oral   SpO2:  98% 99% 98%  Weight:      Height:        Intake/Output Summary (Last 24 hours) at 10/15/2017 1221 Last data filed at 10/15/2017 1100 Gross per 24 hour  Intake 3475.41 ml  Output 500 ml  Net 2975.41 ml   Filed Weights   10/13/17 1233 10/14/17 0730 10/14/17 1707  Weight: 73.5 kg (162 lb) 73.5 kg (162 lb) 78.9 kg (173 lb 15.1 oz)    Examination: General exam: Appears comfortable  HEENT: PERRLA, oral mucosa moist, no sclera icterus or thrush Respiratory system: Clear to auscultation. Respiratory effort normal. Cardiovascular system: S1 & S2 heard, RRR.   Gastrointestinal system: Abdomen soft, non-tender, nondistended. Normal bowel sound. No organomegaly Central nervous system: Alert and oriented. No focal neurological deficits. Extremities: No cyanosis, clubbing or edema Skin: No rashes or ulcers Psychiatry:  Mood & affect appropriate.     Data Reviewed: I have personally reviewed following labs and imaging studies  CBC: Recent Labs  Lab 10/13/17 1252 10/13/17 2119 10/14/17 0310 10/14/17 2047 10/15/17 0346  WBC 10.4 8.0 5.4  --  5.5  HGB 14.1 10.7* 7.6* 10.0* 8.8*  HCT 40.8 31.0* 21.5* 28.7* 25.0*  MCV 103.3* 103.3* 103.9*  --  99.2  PLT 238 162 112*  --  104*   Basic Metabolic Panel: Recent Labs  Lab 10/13/17 1252 10/14/17 0310 10/15/17 0346  NA 138 139 138  K 4.1 3.7 3.8  CL 104 117* 110  CO2 22 18* 22  GLUCOSE 150* 97 104*  BUN 29* 24* 13  CREATININE 1.00 0.67 0.84  CALCIUM 9.6 6.5* 7.8*   GFR: Estimated Creatinine Clearance: 90 mL/min (by C-G formula based on SCr of 0.84 mg/dL). Liver Function Tests: Recent Labs  Lab 10/13/17 1252  AST 29  ALT 33  ALKPHOS 57    BILITOT 1.5*  PROT 7.2  ALBUMIN 4.1   No results for input(s): LIPASE, AMYLASE in the last 168 hours. No results for input(s): AMMONIA in the last 168 hours. Coagulation Profile: Recent Labs  Lab 10/13/17 1252  INR 1.03   Cardiac Enzymes: No results for input(s): CKTOTAL, CKMB, CKMBINDEX, TROPONINI in the last 168 hours. BNP (last 3 results) No results for input(s): PROBNP in the last 8760 hours. HbA1C: No results for input(s): HGBA1C in the last 72 hours. CBG: No results for input(s): GLUCAP in the last 168 hours. Lipid Profile: No results for input(s): CHOL, HDL, LDLCALC, TRIG, CHOLHDL, LDLDIRECT in the last 72 hours. Thyroid Function Tests: No results for input(s): TSH, T4TOTAL, FREET4, T3FREE, THYROIDAB in the last 72 hours. Anemia Panel: Recent Labs    10/13/17 2119  VITAMINB12 123*  FOLATE 11.9  FERRITIN 166  TIBC 284  IRON 107  RETICCTPCT 2.9   Urine analysis: No results found for: COLORURINE, APPEARANCEUR, LABSPEC, PHURINE, GLUCOSEU, HGBUR, BILIRUBINUR, KETONESUR, PROTEINUR, UROBILINOGEN, NITRITE, LEUKOCYTESUR Sepsis Labs: @LABRCNTIP (procalcitonin:4,lacticidven:4) ) Recent Results (from the past 240 hour(s))  MRSA PCR Screening     Status: None   Collection Time: 10/14/17  8:31 PM  Result Value Ref Range Status   MRSA by PCR NEGATIVE NEGATIVE Final    Comment:        The GeneXpert MRSA Assay (FDA approved for NASAL specimens only), is one component of a comprehensive MRSA colonization surveillance program. It is not intended to diagnose MRSA infection nor to guide or monitor treatment for MRSA infections. Performed at [facility], [address] [place], [place] [address]          Radiology Studies: Ir Angiogram Visceral Selective  Result Date: 10/14/2017 CLINICAL DATA:  Upper GI bleed. Acute duodenal bleeding noted on endoscopy, incompletely controlled. Urgent embolization is requested. EXAM: IR EMBO ART VEN HEMORR LYMPH  EXTRAV INC GUIDE ROADMAPPING; ADDITIONAL ARTERIOGRAPHY; SELECTIVE VISCERAL ARTERIOGRAPHY; IR ULTRASOUND GUIDANCE VASC ACCESS RIGHT ANESTHESIA/SEDATION: Intravenous Fentanyl and Versed were administered as conscious sedation during continuous monitoring of the patient's level of consciousness and physiological / cardiorespiratory status by the radiology RN, with a total moderate sedation time of 48 minutes. MEDICATIONS: Lidocaine 1% subcutaneous CONTRAST:  [id number]   IA PROCEDURE: The procedure, risks (including but not limited to bleeding, infection, organ damage ), benefits, and alternatives were explained to the patient and spouse. Questions regarding the procedure were encouraged and answered. As the patient remains partially sedated from recent endoscopy, the spouse understands and consents to the procedure. Right femoral region prepped and draped in usual sterile fashion. Maximal barrier sterile technique was  utilized including caps, mask, sterile gowns, sterile gloves, sterile drape, hand hygiene and skin antiseptic. The right common femoral artery was localized under ultrasound, patency confirmed and documentation stored. Under real-time ultrasound guidance, the vessel was accessed with a 21-gauge micropuncture needle, exchanged over a 018 guidewire for a transitional dilator, through which a 035 guidewire was advanced. Over this, a 5 [place] vascular sheath was placed, through which a 5 [place] C2 catheter was advanced and used to selectively catheterize the superior mesenteric artery for selective arteriography. The C2 was then utilized to selectively catheterize the celiac axis for selective arteriography. A coaxial microcatheter was advanced into the common hepatic artery for sub selective arteriography. The microcatheter was then advanced into the gastroduodenal artery for coil embolization using 4 and 5 mm detachable coils. Final arteriogram from the common hepatic artery was obtained. The catheter and  sheath were removed and hemostasis achieved with the aid of the Exoseal device after confirmatory femoral arteriography. The patient tolerated the procedure well. COMPLICATIONS: None immediate FINDINGS: The superior mesenteric artery is widely patent. No evidence of active extravasation. Replaced right hepatic arterial supply from the SMA, an anatomic variant. No retrograde flow through the gastroduodenal artery. Selective celiac celiac arteriography demonstrates wide patency of the splenic artery, left gastric artery, and common hepatic artery. Left hepatic arterial supply arises from the left gastric artery and there is a small hepatic artery arising from the proper hepatic artery beyond the origin of the GDA. No active extravasation was identified from selective common hepatic or gastroduodenal arteriography. Given the precise anatomic localization provided on recent endoscopy, empiric coil embolization of the gastroduodenal artery was performed as above. Final common hepatic arteriogram demonstrates technically successful occlusion of the GDA. The proper hepatic artery remains widely patent. IMPRESSION: 1. No active extravasation from superior mesenteric, celiac axis, common hepatic or gastroduodenal arteriography. 2. Technically successful empiric coil embolization of gastroduodenal artery. 3. Right hepatic arterial supply from the SMA, and left hepatic arterial supply from the left gastric artery, anatomic normal variants. Electronically Signed   By: Corlis Leak M.D.   On: 10/14/2017 14:32   Ir Angiogram Visceral Selective  Result Date: 10/14/2017 CLINICAL DATA:  Upper GI bleed. Acute duodenal bleeding noted on endoscopy, incompletely controlled. Urgent embolization is requested. EXAM: IR EMBO ART VEN HEMORR LYMPH EXTRAV INC GUIDE ROADMAPPING; ADDITIONAL ARTERIOGRAPHY; SELECTIVE VISCERAL ARTERIOGRAPHY; IR ULTRASOUND GUIDANCE VASC ACCESS RIGHT ANESTHESIA/SEDATION: Intravenous Fentanyl and Versed were  administered as conscious sedation during continuous monitoring of the patient's level of consciousness and physiological / cardiorespiratory status by the radiology RN, with a total moderate sedation time of 48 minutes. MEDICATIONS: Lidocaine 1% subcutaneous CONTRAST:  [id number]   IA PROCEDURE: The procedure, risks (including but not limited to bleeding, infection, organ damage ), benefits, and alternatives were explained to the patient and spouse. Questions regarding the procedure were encouraged and answered. As the patient remains partially sedated from recent endoscopy, the spouse understands and consents to the procedure. Right femoral region prepped and draped in usual sterile fashion. Maximal barrier sterile technique was utilized including caps, mask, sterile gowns, sterile gloves, sterile drape, hand hygiene and skin antiseptic. The right common femoral artery was localized under ultrasound, patency confirmed and documentation stored. Under real-time ultrasound guidance, the vessel was accessed with a 21-gauge micropuncture needle, exchanged over a 018 guidewire for a transitional dilator, through which a 035 guidewire was advanced. Over this, a 5 [place] vascular sheath was placed, through which a 5 [place] C2 catheter was advanced and  used to selectively catheterize the superior mesenteric artery for selective arteriography. The C2 was then utilized to selectively catheterize the celiac axis for selective arteriography. A coaxial microcatheter was advanced into the common hepatic artery for sub selective arteriography. The microcatheter was then advanced into the gastroduodenal artery for coil embolization using 4 and 5 mm detachable coils. Final arteriogram from the common hepatic artery was obtained. The catheter and sheath were removed and hemostasis achieved with the aid of the Exoseal device after confirmatory femoral arteriography. The patient tolerated the procedure well. COMPLICATIONS: None  immediate FINDINGS: The superior mesenteric artery is widely patent. No evidence of active extravasation. Replaced right hepatic arterial supply from the SMA, an anatomic variant. No retrograde flow through the gastroduodenal artery. Selective celiac celiac arteriography demonstrates wide patency of the splenic artery, left gastric artery, and common hepatic artery. Left hepatic arterial supply arises from the left gastric artery and there is a small hepatic artery arising from the proper hepatic artery beyond the origin of the GDA. No active extravasation was identified from selective common hepatic or gastroduodenal arteriography. Given the precise anatomic localization provided on recent endoscopy, empiric coil embolization of the gastroduodenal artery was performed as above. Final common hepatic arteriogram demonstrates technically successful occlusion of the GDA. The proper hepatic artery remains widely patent. IMPRESSION: 1. No active extravasation from superior mesenteric, celiac axis, common hepatic or gastroduodenal arteriography. 2. Technically successful empiric coil embolization of gastroduodenal artery. 3. Right hepatic arterial supply from the SMA, and left hepatic arterial supply from the left gastric artery, anatomic normal variants. Electronically Signed   By: Corlis Leak M.D.   On: 10/14/2017 14:32   Ir Angiogram Selective Each Additional Vessel  Result Date: 10/14/2017 CLINICAL DATA:  Upper GI bleed. Acute duodenal bleeding noted on endoscopy, incompletely controlled. Urgent embolization is requested. EXAM: IR EMBO ART VEN HEMORR LYMPH EXTRAV INC GUIDE ROADMAPPING; ADDITIONAL ARTERIOGRAPHY; SELECTIVE VISCERAL ARTERIOGRAPHY; IR ULTRASOUND GUIDANCE VASC ACCESS RIGHT ANESTHESIA/SEDATION: Intravenous Fentanyl and Versed were administered as conscious sedation during continuous monitoring of the patient's level of consciousness and physiological / cardiorespiratory status by the radiology RN, with a  total moderate sedation time of 48 minutes. MEDICATIONS: Lidocaine 1% subcutaneous CONTRAST:  Isovue-[id number]   IA PROCEDURE: The procedure, risks (including but not limited to bleeding, infection, organ damage ), benefits, and alternatives were explained to the patient and spouse. Questions regarding the procedure were encouraged and answered. As the patient remains partially sedated from recent endoscopy, the spouse understands and consents to the procedure. Right femoral region prepped and draped in usual sterile fashion. Maximal barrier sterile technique was utilized including caps, mask, sterile gowns, sterile gloves, sterile drape, hand hygiene and skin antiseptic. The right common femoral artery was localized under ultrasound, patency confirmed and documentation stored. Under real-time ultrasound guidance, the vessel was accessed with a 21-gauge micropuncture needle, exchanged over a 018 guidewire for a transitional dilator, through which a 035 guidewire was advanced. Over this, a 5 [place] vascular sheath was placed, through which a 5 [place] C2 catheter was advanced and used to selectively catheterize the superior mesenteric artery for selective arteriography. The C2 was then utilized to selectively catheterize the celiac axis for selective arteriography. A coaxial microcatheter was advanced into the common hepatic artery for sub selective arteriography. The microcatheter was then advanced into the gastroduodenal artery for coil embolization using 4 and 5 mm detachable coils. Final arteriogram from the common hepatic artery was obtained. The catheter and sheath were removed and hemostasis  achieved with the aid of the Exoseal device after confirmatory femoral arteriography. The patient tolerated the procedure well. COMPLICATIONS: None immediate FINDINGS: The superior mesenteric artery is widely patent. No evidence of active extravasation. Replaced right hepatic arterial supply from the SMA, an anatomic variant. No  retrograde flow through the gastroduodenal artery. Selective celiac celiac arteriography demonstrates wide patency of the splenic artery, left gastric artery, and common hepatic artery. Left hepatic arterial supply arises from the left gastric artery and there is a small hepatic artery arising from the proper hepatic artery beyond the origin of the GDA. No active extravasation was identified from selective common hepatic or gastroduodenal arteriography. Given the precise anatomic localization provided on recent endoscopy, empiric coil embolization of the gastroduodenal artery was performed as above. Final common hepatic arteriogram demonstrates technically successful occlusion of the GDA. The proper hepatic artery remains widely patent. IMPRESSION: 1. No active extravasation from superior mesenteric, celiac axis, common hepatic or gastroduodenal arteriography. 2. Technically successful empiric coil embolization of gastroduodenal artery. 3. Right hepatic arterial supply from the SMA, and left hepatic arterial supply from the left gastric artery, anatomic normal variants. Electronically Signed   By: Corlis Leak M.D.   On: 10/14/2017 14:32   Ir US Guide Vasc Access Right  Result Date: 10/14/2017 CLINICAL DATA:  Upper GI bleed. Acute duodenal bleeding noted on endoscopy, incompletely controlled. Urgent embolization is requested. EXAM: IR EMBO ART VEN HEMORR LYMPH EXTRAV INC GUIDE ROADMAPPING; ADDITIONAL ARTERIOGRAPHY; SELECTIVE VISCERAL ARTERIOGRAPHY; IR ULTRASOUND GUIDANCE VASC ACCESS RIGHT ANESTHESIA/SEDATION: Intravenous Fentanyl and Versed were administered as conscious sedation during continuous monitoring of the patient's level of consciousness and physiological / cardiorespiratory status by the radiology RN, with a total moderate sedation time of 48 minutes. MEDICATIONS: Lidocaine 1% subcutaneous CONTRAST:  Isovue-300   IA PROCEDURE: The procedure, risks (including but not limited to bleeding, infection, organ  damage ), benefits, and alternatives were explained to the patient and spouse. Questions regarding the procedure were encouraged and answered. As the patient remains partially sedated from recent endoscopy, the spouse understands and consents to the procedure. Right femoral region prepped and draped in usual sterile fashion. Maximal barrier sterile technique was utilized including caps, mask, sterile gowns, sterile gloves, sterile drape, hand hygiene and skin antiseptic. The right common femoral artery was localized under ultrasound, patency confirmed and documentation stored. Under real-time ultrasound guidance, the vessel was accessed with a 21-gauge micropuncture needle, exchanged over a 018 guidewire for a transitional dilator, through which a 035 guidewire was advanced. Over this, a 5 [place] vascular sheath was placed, through which a 5 [place] C2 catheter was advanced and used to selectively catheterize the superior mesenteric artery for selective arteriography. The C2 was then utilized to selectively catheterize the celiac axis for selective arteriography. A coaxial microcatheter was advanced into the common hepatic artery for sub selective arteriography. The microcatheter was then advanced into the gastroduodenal artery for coil embolization using 4 and 5 mm detachable coils. Final arteriogram from the common hepatic artery was obtained. The catheter and sheath were removed and hemostasis achieved with the aid of the Exoseal device after confirmatory femoral arteriography. The patient tolerated the procedure well. COMPLICATIONS: None immediate FINDINGS: The superior mesenteric artery is widely patent. No evidence of active extravasation. Replaced right hepatic arterial supply from the SMA, an anatomic variant. No retrograde flow through the gastroduodenal artery. Selective celiac celiac arteriography demonstrates wide patency of the splenic artery, left gastric artery, and common hepatic artery. Left hepatic  arterial supply  arises from the left gastric artery and there is a small hepatic artery arising from the proper hepatic artery beyond the origin of the GDA. No active extravasation was identified from selective common hepatic or gastroduodenal arteriography. Given the precise anatomic localization provided on recent endoscopy, empiric coil embolization of the gastroduodenal artery was performed as above. Final common hepatic arteriogram demonstrates technically successful occlusion of the GDA. The proper hepatic artery remains widely patent. IMPRESSION: 1. No active extravasation from superior mesenteric, celiac axis, common hepatic or gastroduodenal arteriography. 2. Technically successful empiric coil embolization of gastroduodenal artery. 3. Right hepatic arterial supply from the SMA, and left hepatic arterial supply from the left gastric artery, anatomic normal variants. Electronically Signed   By: Corlis Leak M.D.   On: 10/14/2017 14:32   Ir Embo Art  Peter Minium Hemorr Lymph [place]  Inc Guide Roadmapping  Result Date: 10/14/2017 CLINICAL DATA:  Upper GI bleed. Acute duodenal bleeding noted on endoscopy, incompletely controlled. Urgent embolization is requested. EXAM: IR EMBO ART VEN HEMORR LYMPH EXTRAV INC GUIDE ROADMAPPING; ADDITIONAL ARTERIOGRAPHY; SELECTIVE VISCERAL ARTERIOGRAPHY; IR ULTRASOUND GUIDANCE VASC ACCESS RIGHT ANESTHESIA/SEDATION: Intravenous Fentanyl and Versed were administered as conscious sedation during continuous monitoring of the patient's level of consciousness and physiological / cardiorespiratory status by the radiology RN, with a total moderate sedation time of 48 minutes. MEDICATIONS: Lidocaine 1% subcutaneous CONTRAST:  Isovue-300   IA PROCEDURE: The procedure, risks (including but not limited to bleeding, infection, organ damage ), benefits, and alternatives were explained to the patient and spouse. Questions regarding the procedure were encouraged and answered. As the patient remains  partially sedated from recent endoscopy, the spouse understands and consents to the procedure. Right femoral region prepped and draped in usual sterile fashion. Maximal barrier sterile technique was utilized including caps, mask, sterile gowns, sterile gloves, sterile drape, hand hygiene and skin antiseptic. The right common femoral artery was localized under ultrasound, patency confirmed and documentation stored. Under real-time ultrasound guidance, the vessel was accessed with a 21-gauge micropuncture needle, exchanged over a 018 guidewire for a transitional dilator, through which a 035 guidewire was advanced. Over this, a 5 [place] vascular sheath was placed, through which a 5 [place] C2 catheter was advanced and used to selectively catheterize the superior mesenteric artery for selective arteriography. The C2 was then utilized to selectively catheterize the celiac axis for selective arteriography. A coaxial microcatheter was advanced into the common hepatic artery for sub selective arteriography. The microcatheter was then advanced into the gastroduodenal artery for coil embolization using 4 and 5 mm detachable coils. Final arteriogram from the common hepatic artery was obtained. The catheter and sheath were removed and hemostasis achieved with the aid of the Exoseal device after confirmatory femoral arteriography. The patient tolerated the procedure well. COMPLICATIONS: None immediate FINDINGS: The superior mesenteric artery is widely patent. No evidence of active extravasation. Replaced right hepatic arterial supply from the SMA, an anatomic variant. No retrograde flow through the gastroduodenal artery. Selective celiac celiac arteriography demonstrates wide patency of the splenic artery, left gastric artery, and common hepatic artery. Left hepatic arterial supply arises from the left gastric artery and there is a small hepatic artery arising from the proper hepatic artery beyond the origin of the GDA. No active  extravasation was identified from selective common hepatic or gastroduodenal arteriography. Given the precise anatomic localization provided on recent endoscopy, empiric coil embolization of the gastroduodenal artery was performed as above. Final common hepatic arteriogram demonstrates technically successful occlusion of the GDA. The  proper hepatic artery remains widely patent. IMPRESSION: 1. No active extravasation from superior mesenteric, celiac axis, common hepatic or gastroduodenal arteriography. 2. Technically successful empiric coil embolization of gastroduodenal artery. 3. Right hepatic arterial supply from the SMA, and left hepatic arterial supply from the left gastric artery, anatomic normal variants. Electronically Signed   By: Corlis Leak M.D.   On: 10/14/2017 14:32      Scheduled Meds: . cyanocobalamin  1,000 mcg Subcutaneous Daily  . diclofenac sodium  2 g Topical QID  . [START ON 10/17/2017] pantoprazole  40 mg Intravenous Q12H  . pantoprazole (PROTONIX) IV  40 mg Intravenous Once  . zolpidem  5 mg Oral QHS   Continuous Infusions: . sodium chloride Stopped (10/14/17 1724)  . 0.9 % NaCl with KCl 20 mEq / L 100 mL/hr at 10/15/17 0538  . pantoprozole (PROTONIX) infusion 8 mg/hr (10/15/17 0427)     LOS: 2 days    Time spent in minutes: 35    Calvert Cantor, MD [facility] Pager: [email or web address] Password [email or web address] 10/15/2017, 12:21 PM

## 2017-10-15 NOTE — Progress Notes (Signed)
Patient remains stable. Per GI, OK to transfer. I have spoken with patient and updated him. I will transfer him to med-surg.  Calvert Cantor, MD

## 2017-10-15 NOTE — Progress Notes (Signed)
[facility] Gastroenterology Progress Note  Kenneth Robbins 62 y.o. 12-19-1955  CC:  GI bleed   Subjective: patient feeling better. Denied abdominal pain. Denied nausea vomiting. Stool color is improving. Now having dark stool which appears to be old blood. Ambulating in the hallway.  ROS : negative for chest pain shortness of breath   Objective: Vital signs in last 24 hours: Vitals:   10/15/17 1200 10/15/17 1400  BP:  (!) 151/81  Pulse:    Resp:  17  Temp: 98.9 F (37.2 C)   SpO2:  98%    Physical Exam:  General:  Alert, cooperative, no distress, appears stated age  Head:  Normocephalic, without obvious abnormality, atraumatic  Eyes:  , EOM's intact,   Lungs:   Clear to auscultation bilaterally, respirations unlabored  Heart:  Regular rate and rhythm, S1, S2 normal  Abdomen:   Soft, non-tender, nondistended, bowel sounds present   Extremities: Extremities normal, atraumatic, no  edema  Pulses: 2+ and symmetric    Lab Results: Recent Labs    10/14/17 0310 10/15/17 0346  NA 139 138  K 3.7 3.8  CL 117* 110  CO2 18* 22  GLUCOSE 97 104*  BUN 24* 13  CREATININE 0.67 0.84  CALCIUM 6.5* 7.8*   Recent Labs    10/13/17 1252  AST 29  ALT 33  ALKPHOS 57  BILITOT 1.5*  PROT 7.2  ALBUMIN 4.1   Recent Labs    10/15/17 0346 10/15/17 1251  WBC 5.5 6.0  HGB 8.8* 8.8*  HCT 25.0* 24.9*  MCV 99.2 99.2  PLT 104* 122*   Recent Labs    10/13/17 1252  LABPROT 13.4  INR 1.03      Assessment/Plan: - GI bleed. EGD yesterday showed active arterial bleeding at duodenal sweep from ulcer with large blood vessel. Not able to control bleeding endoscopically. Status post interventional radiology guided embolization of GDA.  - acute blood loss anemia. Hemoglobin stable  Recommendations -------------------------- - patient's hemoglobin is stable. Vial signs stable. Physical exam benign.biopsies negative for H. Pylori. - Advance diet to full liquid. - Okay to transfer to  telemetry from GI standpoint. - Recheck CBC in the morning. - Continue PPI for now. Avoid NSAIDs. - GI will follow   Kathi Der MD, FACP 10/15/2017, 4:55 PM  Contact #  [phone]

## 2017-10-15 NOTE — Progress Notes (Signed)
Referring Physician(s): Levora Angel, P  Supervising Physician: Gilmer Mor  Patient Status:  [facility] - In-pt  Chief Complaint: GI bleed  Subjective:  Patient awake and alert laying in bed with no complaints at this time. Accompanied by wife at bedside. States he had 1 bowel movement last night that had black specks and lighter amounts of blood. Denies headache, lightheadedness, dizziness, syncope, or N/V. Right groin incision c/d/i.  Allergies: Patient has no known allergies.  Medications: Prior to Admission medications   Medication Sig Start Date End Date Taking? Authorizing Provider  Aspirin-Caffeine (BC FAST PAIN RELIEF PO) Take 1-2 packets by mouth daily as needed (hand pain).   Yes [provider]  diphenhydrAMINE (BENADRYL) 25 MG tablet Take 1-2 tablets (25-50 mg total) by mouth every 6 (six) hours as needed for itching or allergies. 04/04/14  Yes Pricilla Loveless, MD  fluticasone (FLONASE) 50 MCG/ACT nasal spray Place 1 spray into both nostrils daily.   Yes [provider]  Multiple Vitamin (MULTIVITAMIN WITH MINERALS) TABS tablet Take 1 tablet by mouth daily.   Yes [provider]  naproxen (NAPROSYN) 500 MG tablet Take 500 mg by mouth 2 (two) times daily as needed for mild pain.   Yes [provider]  VIAGRA 100 MG tablet Take 1 tablet by mouth as needed. Before sexual intercourse 02/04/14  Yes [provider]     Vital Signs: BP 133/64   Pulse (!) 111   Temp 98.9 F (37.2 C) (Oral)   Resp 16   Ht  (1.753 m)   Wt 173 lb 15.1 oz (78.9 kg)   SpO2 98%   BMI 25.69 kg/m   Physical Exam  Constitutional: He is oriented to person, place, and time. He appears well-developed and well-nourished. No distress.  Cardiovascular: Normal rate, regular rhythm, normal heart sounds and intact distal pulses.  No murmur heard. Pulmonary/Chest: Effort normal and breath sounds normal. He has no wheezes.  Neurological: He is alert and  oriented to person, place, and time.  Skin: Skin is warm and dry.  Right groin incision soft without active bleeding or hematoma.  Psychiatric: He has a normal mood and affect. His behavior is normal. Judgment and thought content normal.  Nursing note and vitals reviewed.   Imaging: Ir Angiogram Visceral Selective  Result Date: 10/14/2017 CLINICAL DATA:  Upper GI bleed. Acute duodenal bleeding noted on endoscopy, incompletely controlled. Urgent embolization is requested. EXAM: IR EMBO ART VEN HEMORR LYMPH EXTRAV INC GUIDE ROADMAPPING; ADDITIONAL ARTERIOGRAPHY; SELECTIVE VISCERAL ARTERIOGRAPHY; IR ULTRASOUND GUIDANCE VASC ACCESS RIGHT ANESTHESIA/SEDATION: Intravenous Fentanyl and Versed were administered as conscious sedation during continuous monitoring of the patient's level of consciousness and physiological / cardiorespiratory status by the radiology RN, with a total moderate sedation time of 48 minutes. MEDICATIONS: Lidocaine 1% subcutaneous CONTRAST:  [id number]   IA PROCEDURE: The procedure, risks (including but not limited to bleeding, infection, organ damage ), benefits, and alternatives were explained to the patient and spouse. Questions regarding the procedure were encouraged and answered. As the patient remains partially sedated from recent endoscopy, the spouse understands and consents to the procedure. Right femoral region prepped and draped in usual sterile fashion. Maximal barrier sterile technique was utilized including caps, mask, sterile gowns, sterile gloves, sterile drape, hand hygiene and skin antiseptic. The right common femoral artery was localized under ultrasound, patency confirmed and documentation stored. Under real-time ultrasound guidance, the vessel was accessed with a 21-gauge micropuncture needle, exchanged over a 018 guidewire for a  transitional dilator, through which a 035 guidewire was advanced. Over this, a 5 [place] vascular sheath was placed, through which a 5 [place] C2  catheter was advanced and used to selectively catheterize the superior mesenteric artery for selective arteriography. The C2 was then utilized to selectively catheterize the celiac axis for selective arteriography. A coaxial microcatheter was advanced into the common hepatic artery for sub selective arteriography. The microcatheter was then advanced into the gastroduodenal artery for coil embolization using 4 and 5 mm detachable coils. Final arteriogram from the common hepatic artery was obtained. The catheter and sheath were removed and hemostasis achieved with the aid of the Exoseal device after confirmatory femoral arteriography. The patient tolerated the procedure well. COMPLICATIONS: None immediate FINDINGS: The superior mesenteric artery is widely patent. No evidence of active extravasation. Replaced right hepatic arterial supply from the SMA, an anatomic variant. No retrograde flow through the gastroduodenal artery. Selective celiac celiac arteriography demonstrates wide patency of the splenic artery, left gastric artery, and common hepatic artery. Left hepatic arterial supply arises from the left gastric artery and there is a small hepatic artery arising from the proper hepatic artery beyond the origin of the GDA. No active extravasation was identified from selective common hepatic or gastroduodenal arteriography. Given the precise anatomic localization provided on recent endoscopy, empiric coil embolization of the gastroduodenal artery was performed as above. Final common hepatic arteriogram demonstrates technically successful occlusion of the GDA. The proper hepatic artery remains widely patent. IMPRESSION: 1. No active extravasation from superior mesenteric, celiac axis, common hepatic or gastroduodenal arteriography. 2. Technically successful empiric coil embolization of gastroduodenal artery. 3. Right hepatic arterial supply from the SMA, and left hepatic arterial supply from the left gastric artery,  anatomic normal variants. Electronically Signed   By: Corlis Leak M.D.   On: 10/14/2017 14:32   Ir Angiogram Visceral Selective  Result Date: 10/14/2017 CLINICAL DATA:  Upper GI bleed. Acute duodenal bleeding noted on endoscopy, incompletely controlled. Urgent embolization is requested. EXAM: IR EMBO ART VEN HEMORR LYMPH EXTRAV INC GUIDE ROADMAPPING; ADDITIONAL ARTERIOGRAPHY; SELECTIVE VISCERAL ARTERIOGRAPHY; IR ULTRASOUND GUIDANCE VASC ACCESS RIGHT ANESTHESIA/SEDATION: Intravenous Fentanyl and Versed were administered as conscious sedation during continuous monitoring of the patient's level of consciousness and physiological / cardiorespiratory status by the radiology RN, with a total moderate sedation time of 48 minutes. MEDICATIONS: Lidocaine 1% subcutaneous CONTRAST:  [id number]   IA PROCEDURE: The procedure, risks (including but not limited to bleeding, infection, organ damage ), benefits, and alternatives were explained to the patient and spouse. Questions regarding the procedure were encouraged and answered. As the patient remains partially sedated from recent endoscopy, the spouse understands and consents to the procedure. Right femoral region prepped and draped in usual sterile fashion. Maximal barrier sterile technique was utilized including caps, mask, sterile gowns, sterile gloves, sterile drape, hand hygiene and skin antiseptic. The right common femoral artery was localized under ultrasound, patency confirmed and documentation stored. Under real-time ultrasound guidance, the vessel was accessed with a 21-gauge micropuncture needle, exchanged over a 018 guidewire for a transitional dilator, through which a 035 guidewire was advanced. Over this, a 5 [place] vascular sheath was placed, through which a 5 [place] C2 catheter was advanced and used to selectively catheterize the superior mesenteric artery for selective arteriography. The C2 was then utilized to selectively catheterize the celiac axis for  selective arteriography. A coaxial microcatheter was advanced into the common hepatic artery for sub selective arteriography. The microcatheter was then advanced into the gastroduodenal artery  for coil embolization using 4 and 5 mm detachable coils. Final arteriogram from the common hepatic artery was obtained. The catheter and sheath were removed and hemostasis achieved with the aid of the Exoseal device after confirmatory femoral arteriography. The patient tolerated the procedure well. COMPLICATIONS: None immediate FINDINGS: The superior mesenteric artery is widely patent. No evidence of active extravasation. Replaced right hepatic arterial supply from the SMA, an anatomic variant. No retrograde flow through the gastroduodenal artery. Selective celiac celiac arteriography demonstrates wide patency of the splenic artery, left gastric artery, and common hepatic artery. Left hepatic arterial supply arises from the left gastric artery and there is a small hepatic artery arising from the proper hepatic artery beyond the origin of the GDA. No active extravasation was identified from selective common hepatic or gastroduodenal arteriography. Given the precise anatomic localization provided on recent endoscopy, empiric coil embolization of the gastroduodenal artery was performed as above. Final common hepatic arteriogram demonstrates technically successful occlusion of the GDA. The proper hepatic artery remains widely patent. IMPRESSION: 1. No active extravasation from superior mesenteric, celiac axis, common hepatic or gastroduodenal arteriography. 2. Technically successful empiric coil embolization of gastroduodenal artery. 3. Right hepatic arterial supply from the SMA, and left hepatic arterial supply from the left gastric artery, anatomic normal variants. Electronically Signed   By: Corlis Leak M.D.   On: 10/14/2017 14:32   Ir Angiogram Selective Each Additional Vessel  Result Date: 10/14/2017 CLINICAL DATA:  Upper  GI bleed. Acute duodenal bleeding noted on endoscopy, incompletely controlled. Urgent embolization is requested. EXAM: IR EMBO ART VEN HEMORR LYMPH EXTRAV INC GUIDE ROADMAPPING; ADDITIONAL ARTERIOGRAPHY; SELECTIVE VISCERAL ARTERIOGRAPHY; IR ULTRASOUND GUIDANCE VASC ACCESS RIGHT ANESTHESIA/SEDATION: Intravenous Fentanyl and Versed were administered as conscious sedation during continuous monitoring of the patient's level of consciousness and physiological / cardiorespiratory status by the radiology RN, with a total moderate sedation time of 48 minutes. MEDICATIONS: Lidocaine 1% subcutaneous CONTRAST:  [id number]   IA PROCEDURE: The procedure, risks (including but not limited to bleeding, infection, organ damage ), benefits, and alternatives were explained to the patient and spouse. Questions regarding the procedure were encouraged and answered. As the patient remains partially sedated from recent endoscopy, the spouse understands and consents to the procedure. Right femoral region prepped and draped in usual sterile fashion. Maximal barrier sterile technique was utilized including caps, mask, sterile gowns, sterile gloves, sterile drape, hand hygiene and skin antiseptic. The right common femoral artery was localized under ultrasound, patency confirmed and documentation stored. Under real-time ultrasound guidance, the vessel was accessed with a 21-gauge micropuncture needle, exchanged over a 018 guidewire for a transitional dilator, through which a 035 guidewire was advanced. Over this, a 5 [place] vascular sheath was placed, through which a 5 [place] C2 catheter was advanced and used to selectively catheterize the superior mesenteric artery for selective arteriography. The C2 was then utilized to selectively catheterize the celiac axis for selective arteriography. A coaxial microcatheter was advanced into the common hepatic artery for sub selective arteriography. The microcatheter was then advanced into the  gastroduodenal artery for coil embolization using 4 and 5 mm detachable coils. Final arteriogram from the common hepatic artery was obtained. The catheter and sheath were removed and hemostasis achieved with the aid of the Exoseal device after confirmatory femoral arteriography. The patient tolerated the procedure well. COMPLICATIONS: None immediate FINDINGS: The superior mesenteric artery is widely patent. No evidence of active extravasation. Replaced right hepatic arterial supply from the SMA, an anatomic variant. No retrograde flow  through the gastroduodenal artery. Selective celiac celiac arteriography demonstrates wide patency of the splenic artery, left gastric artery, and common hepatic artery. Left hepatic arterial supply arises from the left gastric artery and there is a small hepatic artery arising from the proper hepatic artery beyond the origin of the GDA. No active extravasation was identified from selective common hepatic or gastroduodenal arteriography. Given the precise anatomic localization provided on recent endoscopy, empiric coil embolization of the gastroduodenal artery was performed as above. Final common hepatic arteriogram demonstrates technically successful occlusion of the GDA. The proper hepatic artery remains widely patent. IMPRESSION: 1. No active extravasation from superior mesenteric, celiac axis, common hepatic or gastroduodenal arteriography. 2. Technically successful empiric coil embolization of gastroduodenal artery. 3. Right hepatic arterial supply from the SMA, and left hepatic arterial supply from the left gastric artery, anatomic normal variants. Electronically Signed   By: Corlis Leak M.D.   On: 10/14/2017 14:32   Ir US Guide Vasc Access Right  Result Date: 10/14/2017 CLINICAL DATA:  Upper GI bleed. Acute duodenal bleeding noted on endoscopy, incompletely controlled. Urgent embolization is requested. EXAM: IR EMBO ART VEN HEMORR LYMPH EXTRAV INC GUIDE ROADMAPPING; ADDITIONAL  ARTERIOGRAPHY; SELECTIVE VISCERAL ARTERIOGRAPHY; IR ULTRASOUND GUIDANCE VASC ACCESS RIGHT ANESTHESIA/SEDATION: Intravenous Fentanyl and Versed were administered as conscious sedation during continuous monitoring of the patient's level of consciousness and physiological / cardiorespiratory status by the radiology RN, with a total moderate sedation time of 48 minutes. MEDICATIONS: Lidocaine 1% subcutaneous CONTRAST:  Isovue-300   IA PROCEDURE: The procedure, risks (including but not limited to bleeding, infection, organ damage ), benefits, and alternatives were explained to the patient and spouse. Questions regarding the procedure were encouraged and answered. As the patient remains partially sedated from recent endoscopy, the spouse understands and consents to the procedure. Right femoral region prepped and draped in usual sterile fashion. Maximal barrier sterile technique was utilized including caps, mask, sterile gowns, sterile gloves, sterile drape, hand hygiene and skin antiseptic. The right common femoral artery was localized under ultrasound, patency confirmed and documentation stored. Under real-time ultrasound guidance, the vessel was accessed with a 21-gauge micropuncture needle, exchanged over a 018 guidewire for a transitional dilator, through which a 035 guidewire was advanced. Over this, a 5 [place] vascular sheath was placed, through which a 5 [place] C2 catheter was advanced and used to selectively catheterize the superior mesenteric artery for selective arteriography. The C2 was then utilized to selectively catheterize the celiac axis for selective arteriography. A coaxial microcatheter was advanced into the common hepatic artery for sub selective arteriography. The microcatheter was then advanced into the gastroduodenal artery for coil embolization using 4 and 5 mm detachable coils. Final arteriogram from the common hepatic artery was obtained. The catheter and sheath were removed and hemostasis  achieved with the aid of the Exoseal device after confirmatory femoral arteriography. The patient tolerated the procedure well. COMPLICATIONS: None immediate FINDINGS: The superior mesenteric artery is widely patent. No evidence of active extravasation. Replaced right hepatic arterial supply from the SMA, an anatomic variant. No retrograde flow through the gastroduodenal artery. Selective celiac celiac arteriography demonstrates wide patency of the splenic artery, left gastric artery, and common hepatic artery. Left hepatic arterial supply arises from the left gastric artery and there is a small hepatic artery arising from the proper hepatic artery beyond the origin of the GDA. No active extravasation was identified from selective common hepatic or gastroduodenal arteriography. Given the precise anatomic localization provided on recent endoscopy, empiric coil embolization  of the gastroduodenal artery was performed as above. Final common hepatic arteriogram demonstrates technically successful occlusion of the GDA. The proper hepatic artery remains widely patent. IMPRESSION: 1. No active extravasation from superior mesenteric, celiac axis, common hepatic or gastroduodenal arteriography. 2. Technically successful empiric coil embolization of gastroduodenal artery. 3. Right hepatic arterial supply from the SMA, and left hepatic arterial supply from the left gastric artery, anatomic normal variants. Electronically Signed   By: Corlis Leak M.D.   On: 10/14/2017 14:32   Ir Embo Art  Peter [facility] Hemorr Lymph [place]  Inc Guide Roadmapping  Result Date: 10/14/2017 CLINICAL DATA:  Upper GI bleed. Acute duodenal bleeding noted on endoscopy, incompletely controlled. Urgent embolization is requested. EXAM: IR EMBO ART VEN HEMORR LYMPH EXTRAV INC GUIDE ROADMAPPING; ADDITIONAL ARTERIOGRAPHY; SELECTIVE VISCERAL ARTERIOGRAPHY; IR ULTRASOUND GUIDANCE VASC ACCESS RIGHT ANESTHESIA/SEDATION: Intravenous Fentanyl and Versed were administered as  conscious sedation during continuous monitoring of the patient's level of consciousness and physiological / cardiorespiratory status by the radiology RN, with a total moderate sedation time of 48 minutes. MEDICATIONS: Lidocaine 1% subcutaneous CONTRAST:  Isovue-300   IA PROCEDURE: The procedure, risks (including but not limited to bleeding, infection, organ damage ), benefits, and alternatives were explained to the patient and spouse. Questions regarding the procedure were encouraged and answered. As the patient remains partially sedated from recent endoscopy, the spouse understands and consents to the procedure. Right femoral region prepped and draped in usual sterile fashion. Maximal barrier sterile technique was utilized including caps, mask, sterile gowns, sterile gloves, sterile drape, hand hygiene and skin antiseptic. The right common femoral artery was localized under ultrasound, patency confirmed and documentation stored. Under real-time ultrasound guidance, the vessel was accessed with a 21-gauge micropuncture needle, exchanged over a 018 guidewire for a transitional dilator, through which a 035 guidewire was advanced. Over this, a 5 [place] vascular sheath was placed, through which a 5 [place] C2 catheter was advanced and used to selectively catheterize the superior mesenteric artery for selective arteriography. The C2 was then utilized to selectively catheterize the celiac axis for selective arteriography. A coaxial microcatheter was advanced into the common hepatic artery for sub selective arteriography. The microcatheter was then advanced into the gastroduodenal artery for coil embolization using 4 and 5 mm detachable coils. Final arteriogram from the common hepatic artery was obtained. The catheter and sheath were removed and hemostasis achieved with the aid of the Exoseal device after confirmatory femoral arteriography. The patient tolerated the procedure well. COMPLICATIONS: None immediate FINDINGS:  The superior mesenteric artery is widely patent. No evidence of active extravasation. Replaced right hepatic arterial supply from the SMA, an anatomic variant. No retrograde flow through the gastroduodenal artery. Selective celiac celiac arteriography demonstrates wide patency of the splenic artery, left gastric artery, and common hepatic artery. Left hepatic arterial supply arises from the left gastric artery and there is a small hepatic artery arising from the proper hepatic artery beyond the origin of the GDA. No active extravasation was identified from selective common hepatic or gastroduodenal arteriography. Given the precise anatomic localization provided on recent endoscopy, empiric coil embolization of the gastroduodenal artery was performed as above. Final common hepatic arteriogram demonstrates technically successful occlusion of the GDA. The proper hepatic artery remains widely patent. IMPRESSION: 1. No active extravasation from superior mesenteric, celiac axis, common hepatic or gastroduodenal arteriography. 2. Technically successful empiric coil embolization of gastroduodenal artery. 3. Right hepatic arterial supply from the SMA, and left hepatic arterial supply from the left gastric artery, anatomic  normal variants. Electronically Signed   By: Corlis Leak M.D.   On: 10/14/2017 14:32    Labs:  CBC: Recent Labs    10/13/17 2119 10/14/17 0310 10/14/17 2047 10/15/17 0346 10/15/17 1251  WBC 8.0 5.4  --  5.5 6.0  HGB 10.7* 7.6* 10.0* 8.8* 8.8*  HCT 31.0* 21.5* 28.7* 25.0* 24.9*  PLT 162 112*  --  104* 122*    COAGS: Recent Labs    10/13/17 1252  INR 1.03    BMP: Recent Labs    10/13/17 1252 10/14/17 0310 10/15/17 0346  NA 138 139 138  K 4.1 3.7 3.8  CL 104 117* 110  CO2 22 18* 22  GLUCOSE 150* 97 104*  BUN 29* 24* 13  CALCIUM 9.6 6.5* 7.8*  CREATININE 1.00 0.67 0.84  GFRNONAA >60 >60 >60  GFRAA >60 >60 >60    LIVER FUNCTION TESTS: Recent Labs    10/13/17 1252    BILITOT 1.5*  AST 29  ALT 33  ALKPHOS 57  PROT 7.2  ALBUMIN 4.1    Assessment and Plan:  GI bleed via duodenal ulcer s/p embolization 10/14/2017. Hemoglobin dropped from 10 last night to 8.8 this AM, it has remained stable at 8.8 today. Denies further bleeding or melena at this time. Right groin stable. IR available if needed.  Electronically Signed: Elwin Mocha, PA-C 10/15/2017, 3:32 PM   I spent a total of 15 Minutes at the the patient's bedside AND on the patient's hospital floor or unit, greater than 50% of which was counseling/coordinating care for duodenal ulcer s/p embolization.

## 2017-10-16 LAB — BASIC METABOLIC PANEL
ANION GAP: 8 (ref 5–15)
BUN: 6 mg/dL (ref 6–20)
CHLORIDE: 107 mmol/L (ref 101–111)
CO2: 24 mmol/L (ref 22–32)
Calcium: 8.4 mg/dL — ABNORMAL LOW (ref 8.9–10.3)
Creatinine, Ser: 0.78 mg/dL (ref 0.61–1.24)
GFR calc non Af Amer: >60 mL/min (ref >60)
GLUCOSE: 108 mg/dL — AB (ref 65–99)
Potassium: 3.5 mmol/L (ref 3.5–5.1)
Sodium: 139 mmol/L (ref 135–145)

## 2017-10-16 LAB — CBC
HEMATOCRIT: 25.2 % — AB (ref 39.0–52.0)
HEMOGLOBIN: 8.8 g/dL — AB (ref 13.0–17.0)
MCH: 34.6 pg — AB (ref 26.0–34.0)
MCHC: 34.9 g/dL (ref 30.0–36.0)
MCV: 99.2 fL (ref 78.0–100.0)
Platelets: 132 10*3/uL — ABNORMAL LOW (ref 150–400)
RBC: 2.54 MIL/uL — AB (ref 4.22–5.81)
RDW: 14.7 % (ref 11.5–15.5)
WBC: 4.7 10*3/uL (ref 4.0–10.5)

## 2017-10-16 MED ORDER — PANTOPRAZOLE SODIUM 40 MG PO TBEC: 40.0000 mg | DELAYED_RELEASE_TABLET | Freq: Two times a day (BID) | ORAL | 0 refills | Status: AC

## 2017-10-16 MED ORDER — PANTOPRAZOLE SODIUM 40 MG PO TBEC: 40.0000 mg | DELAYED_RELEASE_TABLET | Freq: Two times a day (BID) | ORAL | Status: DC

## 2017-10-16 NOTE — Progress Notes (Signed)
Subjective: Tolerating liquids. No abdominal pain. No blood in stool.  Objective: Vital signs in last 24 hours: Temp:  [98.4 F (36.9 C)-98.9 F (37.2 C)] 98.6 F (37 C) (05/08 0540) Pulse Rate:  [85-87] 87 (05/08 0540) Resp:  [16-20] 18 (05/08 0540) BP: (124-151)/(61-82) 124/66 (05/08 0540) SpO2:  [98 %-100 %] 99 % (05/08 0540) Weight change:  Last BM Date: 10/14/17  PE: GEN:  NAD ABD:  Soft, non-tender  Lab Results: CBC    Component Value Date/Time   WBC 4.7 10/16/2017 0614   RBC 2.54 (L) 10/16/2017 0614   HGB 8.8 (L) 10/16/2017 0614   HCT 25.2 (L) 10/16/2017 0614   PLT 132 (L) 10/16/2017 0614   MCV 99.2 10/16/2017 0614   MCH 34.6 (H) 10/16/2017 0614   MCHC 34.9 10/16/2017 0614   RDW 14.7 10/16/2017 0614   CMP     Component Value Date/Time   NA 139 10/16/2017 0614   K 3.5 10/16/2017 0614   CL 107 10/16/2017 0614   CO2 24 10/16/2017 0614   GLUCOSE 108 (H) 10/16/2017 0614   BUN 6 10/16/2017 0614   CREATININE 0.78 10/16/2017 0614   CALCIUM 8.4 (L) 10/16/2017 0614   PROT 7.2 10/13/2017 1252   ALBUMIN 4.1 10/13/2017 1252   AST 29 10/13/2017 1252   ALT 33 10/13/2017 1252   ALKPHOS 57 10/13/2017 1252   BILITOT 1.5 (H) 10/13/2017 1252   GFRNONAA >60 10/16/2017 0614   GFRAA >60 10/16/2017 1610   Assessment:  1.  Bleeding duodenal ulcer, suspect NSAID-mediated, unable to control with EGD, bleeding stopped after GDA embolization. 2.  Acute blood loss anemia.  Plan:  1.  No NSAIDs. 2.  Protonix 40 mg po bid qac until further notice. 3.  Advance diet. 4.  D/C home late today/early tomorrow ok; we can arrange outpatient follow-up with [facility]. 5.  Will sign-off; please call with questions; thank you for the consult.   Freddy Jaksch 10/16/2017, 10:09 AM   Cell [phone] If no answer or after 5 PM call [phone]

## 2017-10-16 NOTE — Progress Notes (Signed)
Went over discharge instructions and follow up need for follow up GI appointment.  Patient and wife verbalized understanding.  Patient left hospital with wife.  Levora Angel, RN

## 2017-10-16 NOTE — Discharge Summary (Signed)
Physician Discharge Summary  Kenneth Robbins ZOX:[id number] DOB: [birth year] DOA: 10/13/2017  PCP: Catha Gosselin, MD  Admit date: 10/13/2017 Discharge date: 10/16/2017  Time spent: > 35 minutes  Recommendations for Outpatient Follow-up:  1. Follow up with gastroenterologist 2. Also assess hemoglobin levels.   Discharge Diagnoses:  Principal Problem:   Upper GI bleed Active Problems:   Arthritis   Tachycardia   Discharge Condition: stable  Diet recommendation: soft diet  Filed Weights   10/13/17 1233 10/14/17 0730 10/14/17 1707  Weight: 73.5 kg (162 lb) 73.5 kg (162 lb) 78.9 kg (173 lb 15.1 oz)    History of present illness:  62 y.o.malewith medical history ofarthritis who isprescribed naproxen but mainly takes 1 to 2 packs of Goody powders a day. About 2 days ago he began having bloody vomitus and bloody stools and subsequently became lightheaded.     Hospital Course:  Per GI evaluation and recommendations:   1.  Bleeding duodenal ulcer, suspect NSAID-mediated, unable to control with EGD, bleeding stopped after GDA embolization. 2.  Acute blood loss anemia.  1.  No NSAIDs. 2.  Protonix 40 mg po bid qac until further notice. 3.  Advance diet. 4.  D/C home late today/early tomorrow ok; we can arrange outpatient follow-up with [facility]. 5.  Will sign-off; please call with questions; thank you for the consult.    Procedures:  EGD with GDA embolization  Consultations:  GI: last seen by dr Dulce Sellar  Discharge Exam: Vitals:   10/15/17 2040 10/16/17 0540  BP: (!) 148/82 124/66  Pulse: 85 87  Resp: 16 18  Temp: 98.6 F (37 C) 98.6 F (37 C)  SpO2: 100% 99%    General: Pt in nad, alert and awake Cardiovascular: rrr, no rubs Respiratory: no increased wob, no wheezes  Discharge Instructions   Discharge Instructions    Call MD for:  severe uncontrolled pain   Complete by:  As directed    Call MD for:  temperature >100.4   Complete by:  As directed    Diet -  low sodium heart healthy   Complete by:  As directed    Discharge instructions   Complete by:  As directed    Please follow up with the [facility] gastroenterologist after hospital discharge.   Increase activity slowly   Complete by:  As directed      Allergies as of 10/16/2017   No Known Allergies     Medication List    STOP taking these medications   BC FAST PAIN RELIEF PO   naproxen 500 MG tablet Commonly known as:  NAPROSYN     TAKE these medications   diphenhydrAMINE 25 MG tablet Commonly known as:  BENADRYL Take 1-2 tablets (25-50 mg total) by mouth every 6 (six) hours as needed for itching or allergies.   FLONASE 50 MCG/ACT nasal spray Generic drug:  fluticasone Place 1 spray into both nostrils daily.   multivitamin with minerals Tabs tablet Take 1 tablet by mouth daily.   pantoprazole 40 MG tablet Commonly known as:  PROTONIX Take 1 tablet (40 mg total) by mouth 2 (two) times daily before a meal.   VIAGRA 100 MG tablet Generic drug:  sildenafil Take 1 tablet by mouth as needed. Before sexual intercourse      No Known Allergies    The results of significant diagnostics from this hospitalization (including imaging, microbiology, ancillary and laboratory) are listed below for reference.    Significant Diagnostic Studies: Ir Angiogram Visceral Selective  Result Date: 10/14/2017 CLINICAL DATA:  Upper GI bleed. Acute duodenal bleeding noted on endoscopy, incompletely controlled. Urgent embolization is requested. EXAM: IR EMBO ART VEN HEMORR LYMPH EXTRAV INC GUIDE ROADMAPPING; ADDITIONAL ARTERIOGRAPHY; SELECTIVE VISCERAL ARTERIOGRAPHY; IR ULTRASOUND GUIDANCE VASC ACCESS RIGHT ANESTHESIA/SEDATION: Intravenous Fentanyl and Versed were administered as conscious sedation during continuous monitoring of the patient's level of consciousness and physiological / cardiorespiratory status by the radiology RN, with a total moderate sedation time of 48 minutes. MEDICATIONS:  Lidocaine 1% subcutaneous CONTRAST:  [id number]   IA PROCEDURE: The procedure, risks (including but not limited to bleeding, infection, organ damage ), benefits, and alternatives were explained to the patient and spouse. Questions regarding the procedure were encouraged and answered. As the patient remains partially sedated from recent endoscopy, the spouse understands and consents to the procedure. Right femoral region prepped and draped in usual sterile fashion. Maximal barrier sterile technique was utilized including caps, mask, sterile gowns, sterile gloves, sterile drape, hand hygiene and skin antiseptic. The right common femoral artery was localized under ultrasound, patency confirmed and documentation stored. Under real-time ultrasound guidance, the vessel was accessed with a 21-gauge micropuncture needle, exchanged over a 018 guidewire for a transitional dilator, through which a 035 guidewire was advanced. Over this, a 5 [place] vascular sheath was placed, through which a 5 [place] C2 catheter was advanced and used to selectively catheterize the superior mesenteric artery for selective arteriography. The C2 was then utilized to selectively catheterize the celiac axis for selective arteriography. A coaxial microcatheter was advanced into the common hepatic artery for sub selective arteriography. The microcatheter was then advanced into the gastroduodenal artery for coil embolization using 4 and 5 mm detachable coils. Final arteriogram from the common hepatic artery was obtained. The catheter and sheath were removed and hemostasis achieved with the aid of the Exoseal device after confirmatory femoral arteriography. The patient tolerated the procedure well. COMPLICATIONS: None immediate FINDINGS: The superior mesenteric artery is widely patent. No evidence of active extravasation. Replaced right hepatic arterial supply from the SMA, an anatomic variant. No retrograde flow through the gastroduodenal artery.  Selective celiac celiac arteriography demonstrates wide patency of the splenic artery, left gastric artery, and common hepatic artery. Left hepatic arterial supply arises from the left gastric artery and there is a small hepatic artery arising from the proper hepatic artery beyond the origin of the GDA. No active extravasation was identified from selective common hepatic or gastroduodenal arteriography. Given the precise anatomic localization provided on recent endoscopy, empiric coil embolization of the gastroduodenal artery was performed as above. Final common hepatic arteriogram demonstrates technically successful occlusion of the GDA. The proper hepatic artery remains widely patent. IMPRESSION: 1. No active extravasation from superior mesenteric, celiac axis, common hepatic or gastroduodenal arteriography. 2. Technically successful empiric coil embolization of gastroduodenal artery. 3. Right hepatic arterial supply from the SMA, and left hepatic arterial supply from the left gastric artery, anatomic normal variants. Electronically Signed   By: Corlis Leak M.D.   On: 10/14/2017 14:32   Ir Angiogram Visceral Selective  Result Date: 10/14/2017 CLINICAL DATA:  Upper GI bleed. Acute duodenal bleeding noted on endoscopy, incompletely controlled. Urgent embolization is requested. EXAM: IR EMBO ART VEN HEMORR LYMPH EXTRAV INC GUIDE ROADMAPPING; ADDITIONAL ARTERIOGRAPHY; SELECTIVE VISCERAL ARTERIOGRAPHY; IR ULTRASOUND GUIDANCE VASC ACCESS RIGHT ANESTHESIA/SEDATION: Intravenous Fentanyl and Versed were administered as conscious sedation during continuous monitoring of the patient's level of consciousness and physiological / cardiorespiratory status by the radiology RN, with a total moderate sedation time  of 48 minutes. MEDICATIONS: Lidocaine 1% subcutaneous CONTRAST:  Isovue-[id number]   IA PROCEDURE: The procedure, risks (including but not limited to bleeding, infection, organ damage ), benefits, and alternatives were  explained to the patient and spouse. Questions regarding the procedure were encouraged and answered. As the patient remains partially sedated from recent endoscopy, the spouse understands and consents to the procedure. Right femoral region prepped and draped in usual sterile fashion. Maximal barrier sterile technique was utilized including caps, mask, sterile gowns, sterile gloves, sterile drape, hand hygiene and skin antiseptic. The right common femoral artery was localized under ultrasound, patency confirmed and documentation stored. Under real-time ultrasound guidance, the vessel was accessed with a 21-gauge micropuncture needle, exchanged over a 018 guidewire for a transitional dilator, through which a 035 guidewire was advanced. Over this, a 5 [place] vascular sheath was placed, through which a 5 [place] C2 catheter was advanced and used to selectively catheterize the superior mesenteric artery for selective arteriography. The C2 was then utilized to selectively catheterize the celiac axis for selective arteriography. A coaxial microcatheter was advanced into the common hepatic artery for sub selective arteriography. The microcatheter was then advanced into the gastroduodenal artery for coil embolization using 4 and 5 mm detachable coils. Final arteriogram from the common hepatic artery was obtained. The catheter and sheath were removed and hemostasis achieved with the aid of the Exoseal device after confirmatory femoral arteriography. The patient tolerated the procedure well. COMPLICATIONS: None immediate FINDINGS: The superior mesenteric artery is widely patent. No evidence of active extravasation. Replaced right hepatic arterial supply from the SMA, an anatomic variant. No retrograde flow through the gastroduodenal artery. Selective celiac celiac arteriography demonstrates wide patency of the splenic artery, left gastric artery, and common hepatic artery. Left hepatic arterial supply arises from the left  gastric artery and there is a small hepatic artery arising from the proper hepatic artery beyond the origin of the GDA. No active extravasation was identified from selective common hepatic or gastroduodenal arteriography. Given the precise anatomic localization provided on recent endoscopy, empiric coil embolization of the gastroduodenal artery was performed as above. Final common hepatic arteriogram demonstrates technically successful occlusion of the GDA. The proper hepatic artery remains widely patent. IMPRESSION: 1. No active extravasation from superior mesenteric, celiac axis, common hepatic or gastroduodenal arteriography. 2. Technically successful empiric coil embolization of gastroduodenal artery. 3. Right hepatic arterial supply from the SMA, and left hepatic arterial supply from the left gastric artery, anatomic normal variants. Electronically Signed   By: Corlis Leak M.D.   On: 10/14/2017 14:32   Ir Angiogram Selective Each Additional Vessel  Result Date: 10/14/2017 CLINICAL DATA:  Upper GI bleed. Acute duodenal bleeding noted on endoscopy, incompletely controlled. Urgent embolization is requested. EXAM: IR EMBO ART VEN HEMORR LYMPH EXTRAV INC GUIDE ROADMAPPING; ADDITIONAL ARTERIOGRAPHY; SELECTIVE VISCERAL ARTERIOGRAPHY; IR ULTRASOUND GUIDANCE VASC ACCESS RIGHT ANESTHESIA/SEDATION: Intravenous Fentanyl and Versed were administered as conscious sedation during continuous monitoring of the patient's level of consciousness and physiological / cardiorespiratory status by the radiology RN, with a total moderate sedation time of 48 minutes. MEDICATIONS: Lidocaine 1% subcutaneous CONTRAST:  [id number]   IA PROCEDURE: The procedure, risks (including but not limited to bleeding, infection, organ damage ), benefits, and alternatives were explained to the patient and spouse. Questions regarding the procedure were encouraged and answered. As the patient remains partially sedated from recent endoscopy, the spouse  understands and consents to the procedure. Right femoral region prepped and draped in usual sterile fashion. Maximal barrier  sterile technique was utilized including caps, mask, sterile gowns, sterile gloves, sterile drape, hand hygiene and skin antiseptic. The right common femoral artery was localized under ultrasound, patency confirmed and documentation stored. Under real-time ultrasound guidance, the vessel was accessed with a 21-gauge micropuncture needle, exchanged over a 018 guidewire for a transitional dilator, through which a 035 guidewire was advanced. Over this, a 5 [place] vascular sheath was placed, through which a 5 [place] C2 catheter was advanced and used to selectively catheterize the superior mesenteric artery for selective arteriography. The C2 was then utilized to selectively catheterize the celiac axis for selective arteriography. A coaxial microcatheter was advanced into the common hepatic artery for sub selective arteriography. The microcatheter was then advanced into the gastroduodenal artery for coil embolization using 4 and 5 mm detachable coils. Final arteriogram from the common hepatic artery was obtained. The catheter and sheath were removed and hemostasis achieved with the aid of the Exoseal device after confirmatory femoral arteriography. The patient tolerated the procedure well. COMPLICATIONS: None immediate FINDINGS: The superior mesenteric artery is widely patent. No evidence of active extravasation. Replaced right hepatic arterial supply from the SMA, an anatomic variant. No retrograde flow through the gastroduodenal artery. Selective celiac celiac arteriography demonstrates wide patency of the splenic artery, left gastric artery, and common hepatic artery. Left hepatic arterial supply arises from the left gastric artery and there is a small hepatic artery arising from the proper hepatic artery beyond the origin of the GDA. No active extravasation was identified from selective common  hepatic or gastroduodenal arteriography. Given the precise anatomic localization provided on recent endoscopy, empiric coil embolization of the gastroduodenal artery was performed as above. Final common hepatic arteriogram demonstrates technically successful occlusion of the GDA. The proper hepatic artery remains widely patent. IMPRESSION: 1. No active extravasation from superior mesenteric, celiac axis, common hepatic or gastroduodenal arteriography. 2. Technically successful empiric coil embolization of gastroduodenal artery. 3. Right hepatic arterial supply from the SMA, and left hepatic arterial supply from the left gastric artery, anatomic normal variants. Electronically Signed   By: Corlis Leak M.D.   On: 10/14/2017 14:32   Ir US Guide Vasc Access Right  Result Date: 10/14/2017 CLINICAL DATA:  Upper GI bleed. Acute duodenal bleeding noted on endoscopy, incompletely controlled. Urgent embolization is requested. EXAM: IR EMBO ART VEN HEMORR LYMPH EXTRAV INC GUIDE ROADMAPPING; ADDITIONAL ARTERIOGRAPHY; SELECTIVE VISCERAL ARTERIOGRAPHY; IR ULTRASOUND GUIDANCE VASC ACCESS RIGHT ANESTHESIA/SEDATION: Intravenous Fentanyl and Versed were administered as conscious sedation during continuous monitoring of the patient's level of consciousness and physiological / cardiorespiratory status by the radiology RN, with a total moderate sedation time of 48 minutes. MEDICATIONS: Lidocaine 1% subcutaneous CONTRAST:  Isovue-300   IA PROCEDURE: The procedure, risks (including but not limited to bleeding, infection, organ damage ), benefits, and alternatives were explained to the patient and spouse. Questions regarding the procedure were encouraged and answered. As the patient remains partially sedated from recent endoscopy, the spouse understands and consents to the procedure. Right femoral region prepped and draped in usual sterile fashion. Maximal barrier sterile technique was utilized including caps, mask, sterile gowns,  sterile gloves, sterile drape, hand hygiene and skin antiseptic. The right common femoral artery was localized under ultrasound, patency confirmed and documentation stored. Under real-time ultrasound guidance, the vessel was accessed with a 21-gauge micropuncture needle, exchanged over a 018 guidewire for a transitional dilator, through which a 035 guidewire was advanced. Over this, a 5 [place] vascular sheath was placed, through which a 5 [place]  C2 catheter was advanced and used to selectively catheterize the superior mesenteric artery for selective arteriography. The C2 was then utilized to selectively catheterize the celiac axis for selective arteriography. A coaxial microcatheter was advanced into the common hepatic artery for sub selective arteriography. The microcatheter was then advanced into the gastroduodenal artery for coil embolization using 4 and 5 mm detachable coils. Final arteriogram from the common hepatic artery was obtained. The catheter and sheath were removed and hemostasis achieved with the aid of the Exoseal device after confirmatory femoral arteriography. The patient tolerated the procedure well. COMPLICATIONS: None immediate FINDINGS: The superior mesenteric artery is widely patent. No evidence of active extravasation. Replaced right hepatic arterial supply from the SMA, an anatomic variant. No retrograde flow through the gastroduodenal artery. Selective celiac celiac arteriography demonstrates wide patency of the splenic artery, left gastric artery, and common hepatic artery. Left hepatic arterial supply arises from the left gastric artery and there is a small hepatic artery arising from the proper hepatic artery beyond the origin of the GDA. No active extravasation was identified from selective common hepatic or gastroduodenal arteriography. Given the precise anatomic localization provided on recent endoscopy, empiric coil embolization of the gastroduodenal artery was performed as above.  Final common hepatic arteriogram demonstrates technically successful occlusion of the GDA. The proper hepatic artery remains widely patent. IMPRESSION: 1. No active extravasation from superior mesenteric, celiac axis, common hepatic or gastroduodenal arteriography. 2. Technically successful empiric coil embolization of gastroduodenal artery. 3. Right hepatic arterial supply from the SMA, and left hepatic arterial supply from the left gastric artery, anatomic normal variants. Electronically Signed   By: Corlis Leak M.D.   On: 10/14/2017 14:32   Ir Embo Art  Peter Minium Hemorr Lymph [place]  Inc Guide Roadmapping  Result Date: 10/14/2017 CLINICAL DATA:  Upper GI bleed. Acute duodenal bleeding noted on endoscopy, incompletely controlled. Urgent embolization is requested. EXAM: IR EMBO ART VEN HEMORR LYMPH EXTRAV INC GUIDE ROADMAPPING; ADDITIONAL ARTERIOGRAPHY; SELECTIVE VISCERAL ARTERIOGRAPHY; IR ULTRASOUND GUIDANCE VASC ACCESS RIGHT ANESTHESIA/SEDATION: Intravenous Fentanyl and Versed were administered as conscious sedation during continuous monitoring of the patient's level of consciousness and physiological / cardiorespiratory status by the radiology RN, with a total moderate sedation time of 48 minutes. MEDICATIONS: Lidocaine 1% subcutaneous CONTRAST:  Isovue-300   IA PROCEDURE: The procedure, risks (including but not limited to bleeding, infection, organ damage ), benefits, and alternatives were explained to the patient and spouse. Questions regarding the procedure were encouraged and answered. As the patient remains partially sedated from recent endoscopy, the spouse understands and consents to the procedure. Right femoral region prepped and draped in usual sterile fashion. Maximal barrier sterile technique was utilized including caps, mask, sterile gowns, sterile gloves, sterile drape, hand hygiene and skin antiseptic. The right common femoral artery was localized under ultrasound, patency confirmed and documentation  stored. Under real-time ultrasound guidance, the vessel was accessed with a 21-gauge micropuncture needle, exchanged over a 018 guidewire for a transitional dilator, through which a 035 guidewire was advanced. Over this, a 5 [place] vascular sheath was placed, through which a 5 [place] C2 catheter was advanced and used to selectively catheterize the superior mesenteric artery for selective arteriography. The C2 was then utilized to selectively catheterize the celiac axis for selective arteriography. A coaxial microcatheter was advanced into the common hepatic artery for sub selective arteriography. The microcatheter was then advanced into the gastroduodenal artery for coil embolization using 4 and 5 mm detachable coils. Final arteriogram from the common hepatic  artery was obtained. The catheter and sheath were removed and hemostasis achieved with the aid of the Exoseal device after confirmatory femoral arteriography. The patient tolerated the procedure well. COMPLICATIONS: None immediate FINDINGS: The superior mesenteric artery is widely patent. No evidence of active extravasation. Replaced right hepatic arterial supply from the SMA, an anatomic variant. No retrograde flow through the gastroduodenal artery. Selective celiac celiac arteriography demonstrates wide patency of the splenic artery, left gastric artery, and common hepatic artery. Left hepatic arterial supply arises from the left gastric artery and there is a small hepatic artery arising from the proper hepatic artery beyond the origin of the GDA. No active extravasation was identified from selective common hepatic or gastroduodenal arteriography. Given the precise anatomic localization provided on recent endoscopy, empiric coil embolization of the gastroduodenal artery was performed as above. Final common hepatic arteriogram demonstrates technically successful occlusion of the GDA. The proper hepatic artery remains widely patent. IMPRESSION: 1. No active  extravasation from superior mesenteric, celiac axis, common hepatic or gastroduodenal arteriography. 2. Technically successful empiric coil embolization of gastroduodenal artery. 3. Right hepatic arterial supply from the SMA, and left hepatic arterial supply from the left gastric artery, anatomic normal variants. Electronically Signed   By: Corlis Leak M.D.   On: 10/14/2017 14:32    Microbiology: Recent Results (from the past 240 hour(s))  MRSA PCR Screening     Status: None   Collection Time: 10/14/17  8:31 PM  Result Value Ref Range Status   MRSA by PCR NEGATIVE NEGATIVE Final    Comment:        The GeneXpert MRSA Assay (FDA approved for NASAL specimens only), is one component of a comprehensive MRSA colonization surveillance program. It is not intended to diagnose MRSA infection nor to guide or monitor treatment for MRSA infections. Performed at [facility], [address], [place], [place] [address]      Labs: Basic Metabolic Panel: Recent Labs  Lab 10/13/17 1252 10/14/17 0310 10/15/17 0346 10/16/17 0614  NA 138 139 138 139  K 4.1 3.7 3.8 3.5  CL 104 117* 110 107  CO2 22 18* 22 24  GLUCOSE 150* 97 104* 108*  BUN 29* 24* 13 6  CREATININE 1.00 0.67 0.84 0.78  CALCIUM 9.6 6.5* 7.8* 8.4*   Liver Function Tests: Recent Labs  Lab 10/13/17 1252  AST 29  ALT 33  ALKPHOS 57  BILITOT 1.5*  PROT 7.2  ALBUMIN 4.1   No results for input(s): LIPASE, AMYLASE in the last 168 hours. No results for input(s): AMMONIA in the last 168 hours. CBC: Recent Labs  Lab 10/13/17 2119 10/14/17 0310 10/14/17 2047 10/15/17 0346 10/15/17 1251 10/16/17 0614  WBC 8.0 5.4  --  5.5 6.0 4.7  HGB 10.7* 7.6* 10.0* 8.8* 8.8* 8.8*  HCT 31.0* 21.5* 28.7* 25.0* 24.9* 25.2*  MCV 103.3* 103.9*  --  99.2 99.2 99.2  PLT 162 112*  --  104* 122* 132*   Cardiac Enzymes: No results for input(s): CKTOTAL, CKMB, CKMBINDEX, TROPONINI in the last 168 hours. BNP: BNP (last 3  results) No results for input(s): BNP in the last 8760 hours.  ProBNP (last 3 results) No results for input(s): PROBNP in the last 8760 hours.  CBG: No results for input(s): GLUCAP in the last 168 hours.   Signed:  Penny Pia MD.  [facility] 10/16/2017, 1:02 PM

## 2017-10-17 ENCOUNTER — Encounter (HOSPITAL_COMMUNITY): Payer: Self-pay | Admitting: Gastroenterology

## 2019-08-21 ENCOUNTER — Ambulatory Visit: Payer: No Typology Code available for payment source | Attending: Internal Medicine

## 2019-08-21 DIAGNOSIS — Z23 Encounter for immunization: Secondary | ICD-10-CM

## 2019-08-21 NOTE — Progress Notes (Signed)
   Covid-19 Vaccination Clinic  Name:  Kenneth Robbins    MRN: [id number] DOB: Nov 05, 1955  08/21/2019  Mr. Kenneth Robbins was observed post Covid-19 immunization for 15 minutes without incident. He was provided with Vaccine Information Sheet and instruction to access the V-Safe system.   Mr. Kenneth Robbins was instructed to call 911 with any severe reactions post vaccine: Marland Kitchen Difficulty breathing  . Swelling of face and throat  . A fast heartbeat  . A bad rash all over body  . Dizziness and weakness   Immunizations Administered    Name Date Dose VIS Date Route   Pfizer COVID-19 Vaccine 08/21/2019  3:04 PM 0.3 mL 05/22/2019 Intramuscular   Manufacturer: [employer], [employer]   Lot: [id number]   NDC: [address]-0

## 2019-09-14 ENCOUNTER — Ambulatory Visit: Payer: No Typology Code available for payment source | Attending: Internal Medicine

## 2019-09-14 DIAGNOSIS — Z23 Encounter for immunization: Secondary | ICD-10-CM

## 2019-09-14 NOTE — Progress Notes (Signed)
   Covid-19 Vaccination Clinic  Name:  Kenneth Robbins    MRN: [id number] DOB: 1955-10-14  09/14/2019  [name] was observed post Covid-19 immunization for 15 minutes without incident. He was provided with Vaccine Information Sheet and instruction to access the V-Safe system.   [name] was instructed to call 911 with any severe reactions post vaccine: Marland Kitchen Difficulty breathing  . Swelling of face and throat  . A fast heartbeat  . A bad rash all over body  . Dizziness and weakness   Immunizations Administered    Name Date Dose VIS Date Route   Pfizer COVID-19 Vaccine 09/14/2019  4:31 PM 0.3 mL 05/22/2019 Intramuscular   Manufacturer: [employer], [employer]   Lot: [id number]   NDC: [address]-2

## 2020-06-24 DIAGNOSIS — E538 Deficiency of other specified B group vitamins: Secondary | ICD-10-CM | POA: Diagnosis not present

## 2020-06-24 DIAGNOSIS — N400 Enlarged prostate without lower urinary tract symptoms: Secondary | ICD-10-CM | POA: Diagnosis not present

## 2020-06-24 DIAGNOSIS — E782 Mixed hyperlipidemia: Secondary | ICD-10-CM | POA: Diagnosis not present

## 2020-06-24 DIAGNOSIS — Z0000 Encounter for general adult medical examination without abnormal findings: Secondary | ICD-10-CM | POA: Diagnosis not present

## 2020-06-24 DIAGNOSIS — Z1159 Encounter for screening for other viral diseases: Secondary | ICD-10-CM | POA: Diagnosis not present

## 2020-06-24 DIAGNOSIS — Z8719 Personal history of other diseases of the digestive system: Secondary | ICD-10-CM | POA: Diagnosis not present

## 2020-06-28 ENCOUNTER — Other Ambulatory Visit: Payer: Self-pay | Admitting: Family Medicine

## 2020-06-28 DIAGNOSIS — Z136 Encounter for screening for cardiovascular disorders: Secondary | ICD-10-CM

## 2020-06-28 DIAGNOSIS — Z87891 Personal history of nicotine dependence: Secondary | ICD-10-CM

## 2020-07-05 ENCOUNTER — Ambulatory Visit
Admission: RE | Admit: 2020-07-05 | Discharge: 2020-07-05 | Disposition: A | Discharge status: Home / Self Care | Payer: No Typology Code available for payment source | Source: Ambulatory Visit | Attending: Family Medicine | Admitting: Family Medicine

## 2020-07-05 DIAGNOSIS — Z87891 Personal history of nicotine dependence: Secondary | ICD-10-CM

## 2020-07-05 DIAGNOSIS — Z136 Encounter for screening for cardiovascular disorders: Secondary | ICD-10-CM

## 2020-07-06 DIAGNOSIS — L820 Inflamed seborrheic keratosis: Secondary | ICD-10-CM | POA: Diagnosis not present

## 2020-07-06 DIAGNOSIS — D225 Melanocytic nevi of trunk: Secondary | ICD-10-CM | POA: Diagnosis not present

## 2020-07-06 DIAGNOSIS — D485 Neoplasm of uncertain behavior of skin: Secondary | ICD-10-CM | POA: Diagnosis not present

## 2020-07-06 DIAGNOSIS — L821 Other seborrheic keratosis: Secondary | ICD-10-CM | POA: Diagnosis not present

## 2020-07-20 ENCOUNTER — Other Ambulatory Visit: Payer: Self-pay | Admitting: *Deleted

## 2020-07-20 DIAGNOSIS — Z87891 Personal history of nicotine dependence: Secondary | ICD-10-CM

## 2020-08-11 DIAGNOSIS — Z86010 Personal history of colonic polyps: Secondary | ICD-10-CM | POA: Diagnosis not present

## 2020-08-11 DIAGNOSIS — Z8719 Personal history of other diseases of the digestive system: Secondary | ICD-10-CM | POA: Diagnosis not present

## 2020-08-29 ENCOUNTER — Ambulatory Visit (INDEPENDENT_AMBULATORY_CARE_PROVIDER_SITE_OTHER): Payer: Medicare Other | Admitting: Acute Care

## 2020-08-29 ENCOUNTER — Ambulatory Visit
Admission: RE | Admit: 2020-08-29 | Discharge: 2020-08-29 | Disposition: A | Discharge status: Home / Self Care | Payer: Medicare Other | Source: Ambulatory Visit | Attending: Acute Care | Admitting: Acute Care

## 2020-08-29 ENCOUNTER — Other Ambulatory Visit: Payer: Self-pay

## 2020-08-29 ENCOUNTER — Encounter: Payer: Self-pay | Admitting: Acute Care

## 2020-08-29 VITALS — BP 128/74 | HR 69 | Temp 98.7°F | Ht 69.0 in | Wt 190.8 lb

## 2020-08-29 DIAGNOSIS — Z87891 Personal history of nicotine dependence: Secondary | ICD-10-CM | POA: Diagnosis not present

## 2020-08-29 DIAGNOSIS — J432 Centrilobular emphysema: Secondary | ICD-10-CM | POA: Diagnosis not present

## 2020-08-29 DIAGNOSIS — K449 Diaphragmatic hernia without obstruction or gangrene: Secondary | ICD-10-CM | POA: Diagnosis not present

## 2020-08-29 DIAGNOSIS — Z122 Encounter for screening for malignant neoplasm of respiratory organs: Secondary | ICD-10-CM

## 2020-08-29 DIAGNOSIS — I2510 Atherosclerotic heart disease of native coronary artery without angina pectoris: Secondary | ICD-10-CM | POA: Diagnosis not present

## 2020-08-29 NOTE — Progress Notes (Signed)
Shared Decision Making Visit Lung Cancer Screening Program [phone])   Eligibility:  Age 65 y.o.  Pack Years Smoking History Calculation 36 pack year smoking history (# packs/per year x # years smoked)  Recent History of coughing up blood  no  Unexplained weight loss? no ( >Than 15 pounds within the last 6 months )  Prior History Lung / other cancer no (Diagnosis within the last 5 years already requiring surveillance chest CT Scans).  Smoking Status Former Smoker  Former Smokers: Years since quit: 11 years  Quit Date: 2011  Visit Components:  Discussion included one or more decision making aids. yes  Discussion included risk/benefits of screening. yes  Discussion included potential follow up diagnostic testing for abnormal scans. yes  Discussion included meaning and risk of over diagnosis. yes  Discussion included meaning and risk of False Positives. yes  Discussion included meaning of total radiation exposure. yes  Counseling Included:  Importance of adherence to annual lung cancer LDCT screening. yes  Impact of comorbidities on ability to participate in the program. yes  Ability and willingness to under diagnostic treatment. yes  Smoking Cessation Counseling:  Current Smokers:   Discussed importance of smoking cessation. yes  Information about tobacco cessation classes and interventions provided to patient. yes  Patient provided with "ticket" for LDCT Scan. yes  Symptomatic Patient. no  Counseling NA    Diagnosis Code: Tobacco Use Z72.0  Asymptomatic Patient yes  Counseling (Intermediate counseling: > three minutes counseling) Q5956  Former Smokers:   Discussed the importance of maintaining cigarette abstinence. yes  Diagnosis Code: Personal History of Nicotine Dependence. L87.564  Information about tobacco cessation classes and interventions provided to patient. Yes  Patient provided with "ticket" for LDCT Scan. yes  Written Order for Lung  Cancer Screening with LDCT placed in Epic. Yes (CT Chest Lung Cancer Screening Low Dose W/O CM) [id number] Z12.2-Screening of respiratory organs Z87.891-Personal history of nicotine dependence  I spent 25 minutes of face to face time with Mr. Geisen discussing the risks and benefits of lung cancer screening. We viewed a power point together that explained in detail the above noted topics. We took the time to pause the power point at intervals to allow for questions to be asked and answered to ensure understanding. We discussed that he had taken the single most powerful action possible to decrease his risk of developing lung cancer when he quit smoking. I counseled him to remain smoke free, and to contact me if he ever had the desire to smoke again so that I can provide resources and tools to help support the effort to remain smoke free. We discussed the time and location of the scan, and that either  Abigail Miyamoto RN or I will call with the results within  24-48 hours of receiving them. He has my card and contact information in the event he needs to speak with me, in addition to a copy of the power point we reviewed as a resource. He verbalized understanding of all of the above and had no further questions upon leaving the office.     I explained to the patient that there has been a high incidence of coronary artery disease noted on these exams. I explained that this is a non-gated exam therefore degree or severity cannot be determined. This patient is not currently on statin therapy. I have asked the patient to follow-up with their PCP regarding any incidental finding of coronary artery disease and management with diet or medication as  they feel is clinically indicated. The patient verbalized understanding of the above and had no further questions.     Bevelyn Ngo, NP 08/29/2020 2:27 PM

## 2020-08-29 NOTE — Patient Instructions (Signed)
Thank you for participating in the [facility]. It was our pleasure to meet you today. We will call you with the results of your scan within the next few days. Your scan will be assigned a Lung RADS category score by the physicians reading the scans.  This Lung RADS score determines follow up scanning.  See below for description of categories, and follow up screening recommendations. We will be in touch to schedule your follow up screening annually or based on recommendations of our providers. We will fax a copy of your scan results to your Primary Care Physician, or the physician who referred you to the program, to ensure they have the results. Please call the office if you have any questions or concerns regarding your scanning experience or results.  Our office number is [phone]. Please speak with Denise Phelps, RN. She is our Lung Cancer Screening RN. If she is unavailable when you call, please have the office staff send her a message. She will return your call at her earliest convenience. Remember, if your scan is normal, we will scan you annually as long as you continue to meet the criteria for the program. (Age 55-77, Current smoker or smoker who has quit within the last 15 years). If you are a smoker, remember, quitting is the single most powerful action that you can take to decrease your risk of lung cancer and other pulmonary, breathing related problems. We know quitting is hard, and we are here to help.  Please let us know if there is anything we can do to help you meet your goal of quitting. If you are a former smoker, congratulations. We are proud of you! Remain smoke free! Remember you can refer friends or family members through the number above.  We will screen them to make sure they meet criteria for the program. Thank you for helping us take better care of you by participating in Lung Screening.  Lung RADS Categories:  Lung RADS 1: no nodules  or definitely non-concerning nodules.  Recommendation is for a repeat annual scan in 12 months.  Lung RADS 2:  nodules that are non-concerning in appearance and behavior with a very low likelihood of becoming an active cancer. Recommendation is for a repeat annual scan in 12 months.  Lung RADS 3: nodules that are probably non-concerning , includes nodules with a low likelihood of becoming an active cancer.  Recommendation is for a 6-month repeat screening scan. Often noted after an upper respiratory illness. We will be in touch to make sure you have no questions, and to schedule your 6-month scan.  Lung RADS 4 A: nodules with concerning findings, recommendation is most often for a follow up scan in 3 months or additional testing based on our provider's assessment of the scan. We will be in touch to make sure you have no questions and to schedule the recommended 3 month follow up scan.  Lung RADS 4 B:  indicates findings that are concerning. We will be in touch with you to schedule additional diagnostic testing based on our provider's  assessment of the scan.   

## 2020-09-09 NOTE — Progress Notes (Signed)
Please call patient and let them  know their  low dose Ct was read as a Lung RADS 1, negative study: no nodules or definitely benign nodules. Radiology recommendation is for a repeat LDCT in 12 months. .Please let them  know we will order and schedule their  annual screening scan for 08/2021. Please let them  know there was notation of CAD on their  scan.  Please remind the patient  that this is a non-gated exam therefore degree or severity of disease  cannot be determined. Please have them  follow up with their PCP regarding potential risk factor modification, dietary therapy or pharmacologic therapy if clinically indicated. Pt.  is not  currently on statin therapy. Please place order for annual  screening scan for  08/2021 and fax results to PCP. Thanks so much.  Angelique Blonder, please fax Dr. Clarene Duke, there was notation of Three vessel CAD . I did see a remote cards note in Epic. Please refer to cardiology if you feel this is clinically appropriate. Thanks so much

## 2020-09-12 ENCOUNTER — Other Ambulatory Visit: Payer: Self-pay | Admitting: *Deleted

## 2020-09-12 DIAGNOSIS — Z87891 Personal history of nicotine dependence: Secondary | ICD-10-CM

## 2020-10-12 DIAGNOSIS — K449 Diaphragmatic hernia without obstruction or gangrene: Secondary | ICD-10-CM | POA: Diagnosis not present

## 2020-10-12 DIAGNOSIS — I700 Atherosclerosis of aorta: Secondary | ICD-10-CM | POA: Diagnosis not present

## 2020-10-12 DIAGNOSIS — J439 Emphysema, unspecified: Secondary | ICD-10-CM | POA: Diagnosis not present

## 2021-03-24 IMAGING — CT CT CHEST LUNG CANCER SCREENING LOW DOSE W/O CM
2 of 5 series · 15 of 40 positions shown, 18 images · non-contrast
Comparison: None.

CLINICAL DATA: 65-year-old asymptomatic male former smoker with 30
pack-year smoking history, quit smoking 11 years prior.

EXAM:
CT CHEST WITHOUT CONTRAST LOW-DOSE FOR LUNG CANCER SCREENING
TECHNIQUE: Multidetector CT imaging of the chest was performed following the
standard protocol without IV contrast.

[Series 4: lung 1.00 br44 cor · coronal · 0.66mm/px · 3 of 355 slices shown]
[im 71/355  lung]
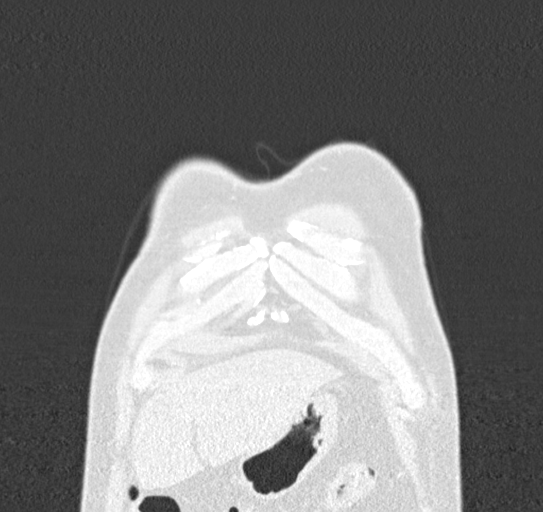
[im 142/355  lung]
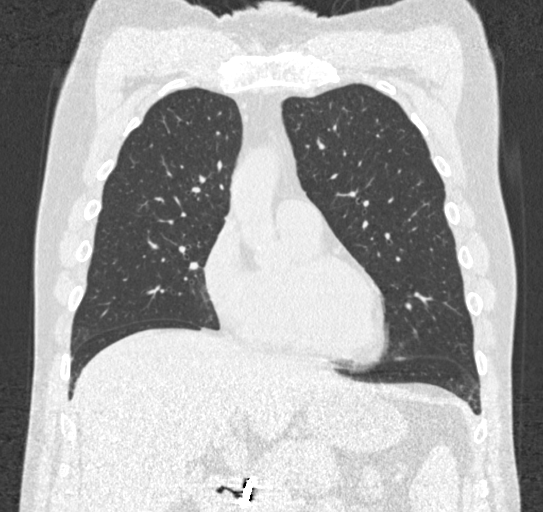
[im 213/355  lung]
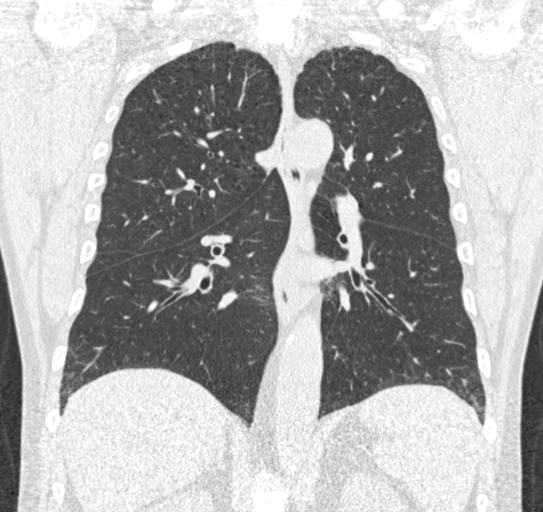

[Series 9: lung 1.00 br60 axial · axial · 0.70mm/px · z∈[-1067,-761]mm · 12 of 338 slices shown, 15 images]
[im 16/338  mediastinal]
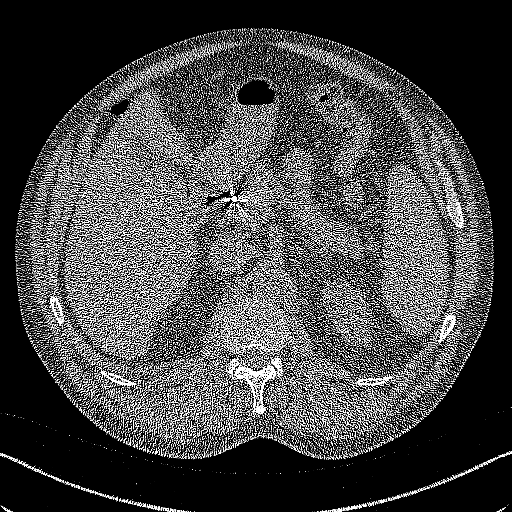
[im 16/338  lung]
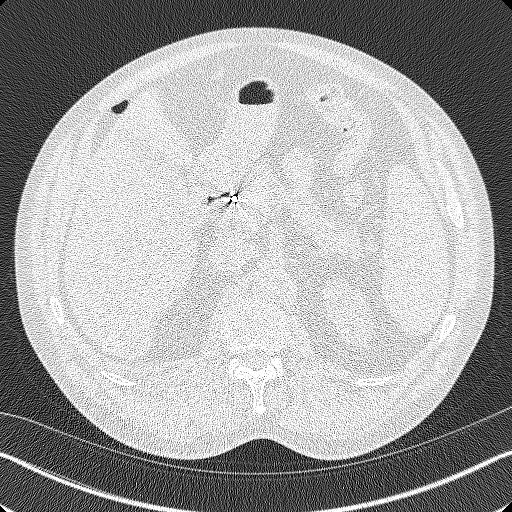
[im 46/338  lung]
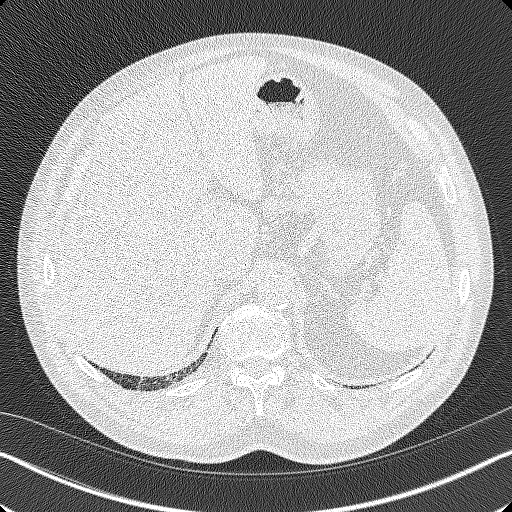
[im 77/338  lung]
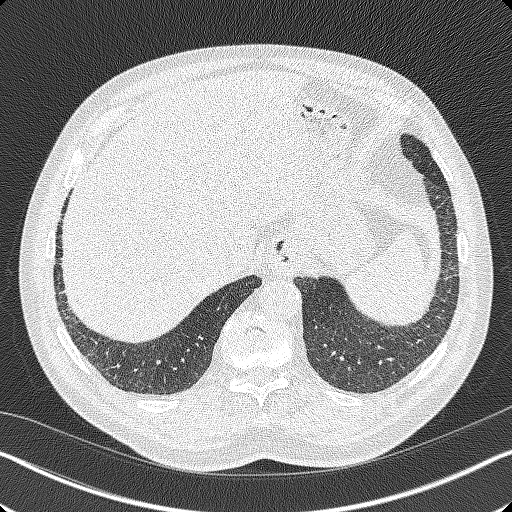
[im 108/338  lung]
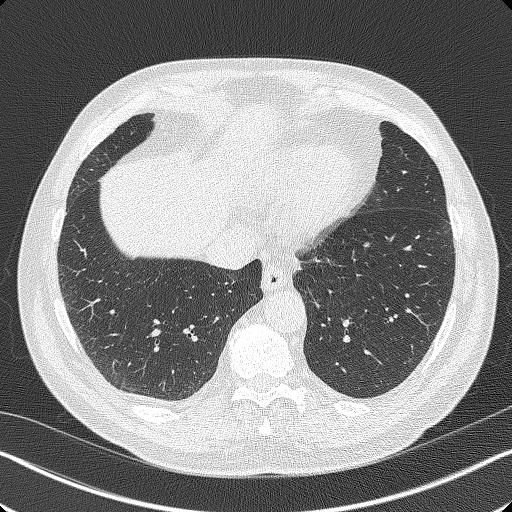
[im 123/338  mediastinal]
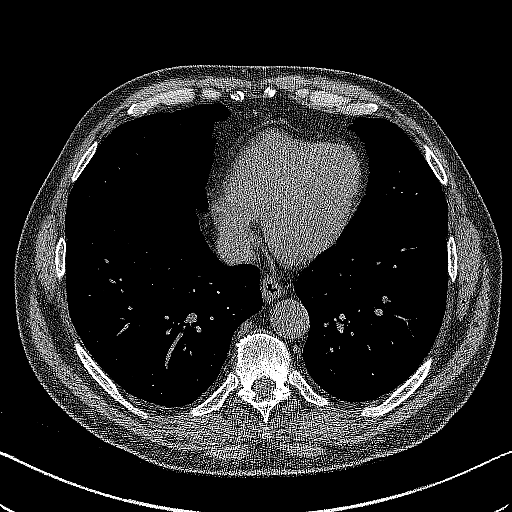
[im 123/338  lung]
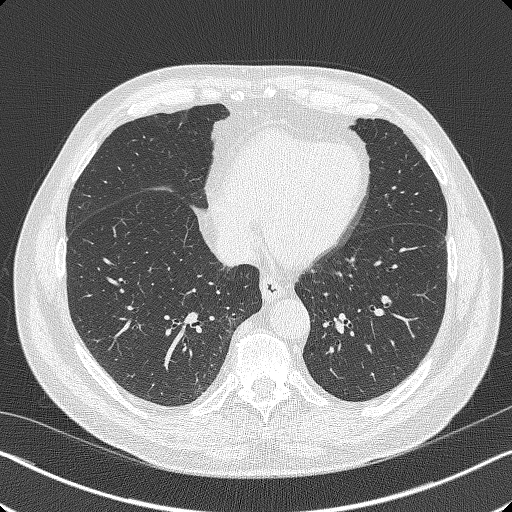
[im 154/338  lung]
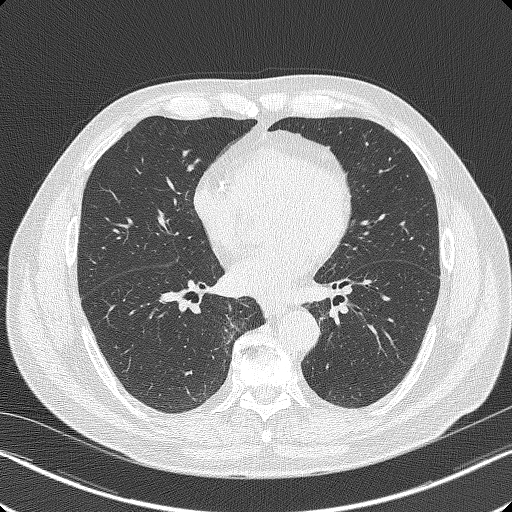
[im 184/338  lung]
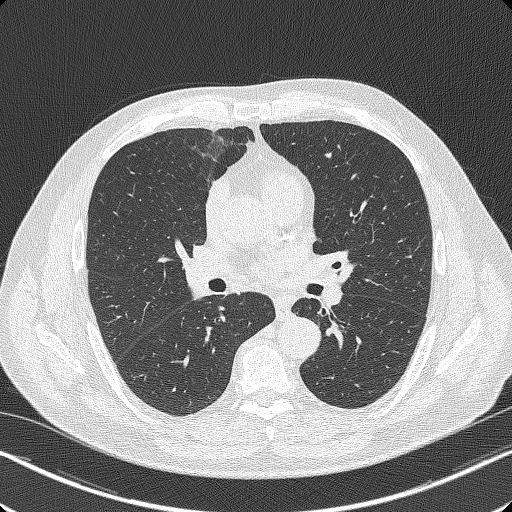
[im 215/338  lung]
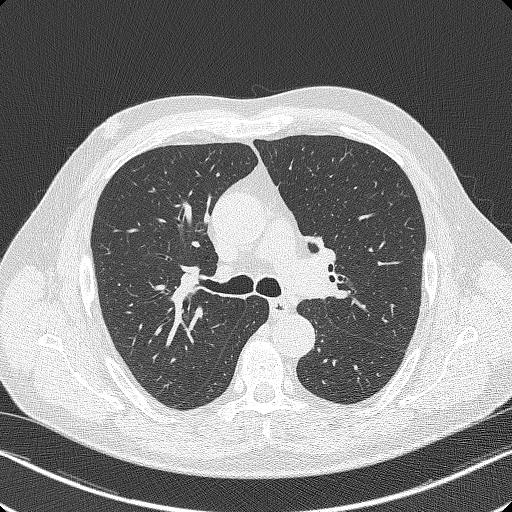
[im 230/338  mediastinal]
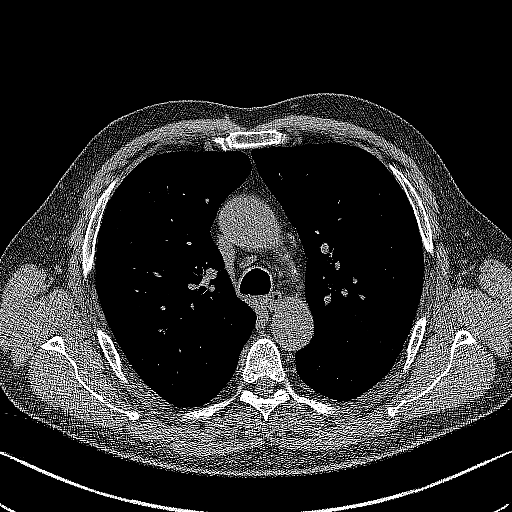
[im 230/338  lung]
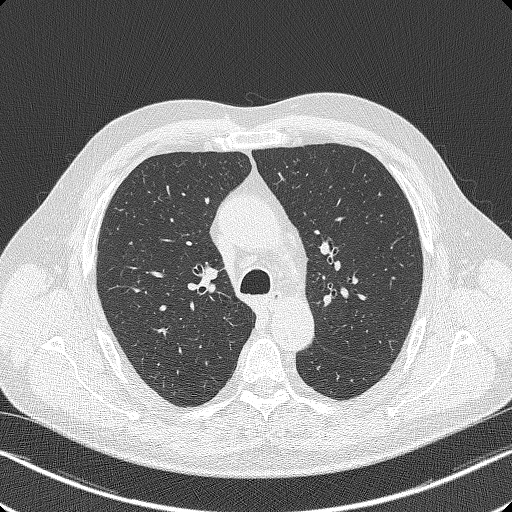
[im 261/338  lung]
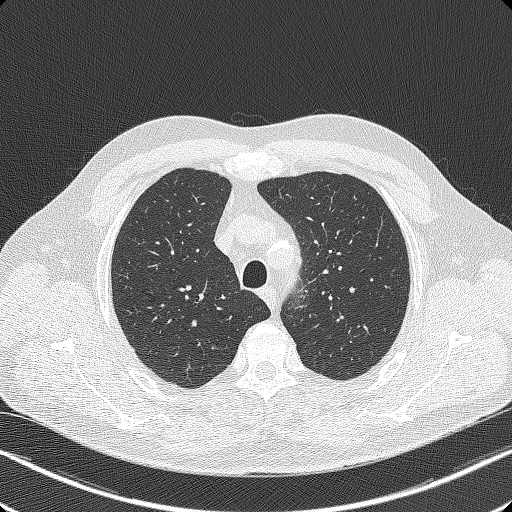
[im 292/338  lung]
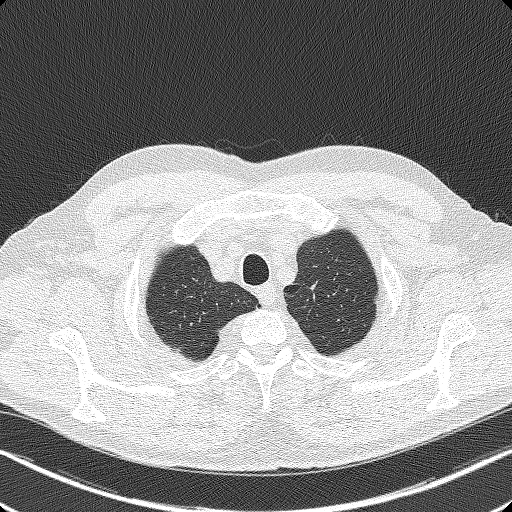
[im 322/338  lung]
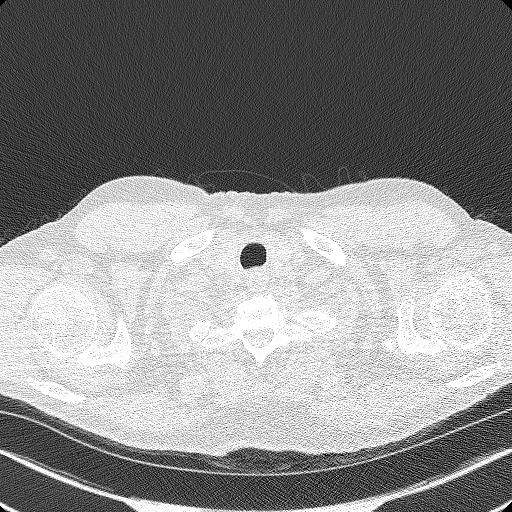

[15 of 40 positions shown; findings below may reference images not displayed]

FINDINGS: Cardiovascular: Normal heart size. No significant pericardial
effusion/thickening. Three-vessel coronary atherosclerosis.
Atherosclerotic nonaneurysmal thoracic aorta. Normal caliber
pulmonary arteries.

Mediastinum/Nodes: No discrete thyroid nodules. Unremarkable
esophagus. No pathologically enlarged axillary, mediastinal or hilar
lymph nodes, noting limited sensitivity for the detection of hilar
adenopathy on this noncontrast study.

Lungs/Pleura: No pneumothorax. No pleural effusion. Mild
centrilobular and paraseptal emphysema. No acute consolidative
airspace disease or lung masses. No significant pulmonary nodules.

Upper abdomen: Small hiatal hernia. Embolic coils noted between the
duodenum and pancreatic neck.

Musculoskeletal: No aggressive appearing focal osseous lesions.
Moderate thoracic spondylosis.
IMPRESSION: 1. Lung-RADS 1, negative. Continue annual screening with low-dose
chest CT without contrast in 12 months.
2. Three-vessel coronary atherosclerosis.
3. Small hiatal hernia.
4. Aortic Atherosclerosis ([id number].B) and Emphysema ([id number].O).

## 2021-06-29 DIAGNOSIS — Z125 Encounter for screening for malignant neoplasm of prostate: Secondary | ICD-10-CM | POA: Diagnosis not present

## 2021-06-29 DIAGNOSIS — N529 Male erectile dysfunction, unspecified: Secondary | ICD-10-CM | POA: Diagnosis not present

## 2021-06-29 DIAGNOSIS — N400 Enlarged prostate without lower urinary tract symptoms: Secondary | ICD-10-CM | POA: Diagnosis not present

## 2021-06-29 DIAGNOSIS — Z23 Encounter for immunization: Secondary | ICD-10-CM | POA: Diagnosis not present

## 2021-06-29 DIAGNOSIS — Z0000 Encounter for general adult medical examination without abnormal findings: Secondary | ICD-10-CM | POA: Diagnosis not present

## 2021-08-28 DIAGNOSIS — J439 Emphysema, unspecified: Secondary | ICD-10-CM | POA: Diagnosis not present

## 2021-08-28 DIAGNOSIS — I700 Atherosclerosis of aorta: Secondary | ICD-10-CM | POA: Diagnosis not present

## 2021-08-28 DIAGNOSIS — K449 Diaphragmatic hernia without obstruction or gangrene: Secondary | ICD-10-CM | POA: Diagnosis not present

## 2021-08-28 DIAGNOSIS — Z0000 Encounter for general adult medical examination without abnormal findings: Secondary | ICD-10-CM | POA: Diagnosis not present

## 2021-10-06 ENCOUNTER — Telehealth: Payer: Self-pay | Admitting: Acute Care

## 2021-10-06 NOTE — Telephone Encounter (Signed)
Contacted patient by phone to schedule annual LDCT. States he is 'not in a position to schedule' at this time.  He has our phone number and will call us when he is ready to schedule. ?

## 2022-01-07 DIAGNOSIS — S0101XA Laceration without foreign body of scalp, initial encounter: Secondary | ICD-10-CM | POA: Diagnosis not present

## 2022-03-28 ENCOUNTER — Encounter: Payer: Self-pay | Admitting: *Deleted

## 2022-07-02 DIAGNOSIS — Z0000 Encounter for general adult medical examination without abnormal findings: Secondary | ICD-10-CM | POA: Diagnosis not present

## 2022-08-29 DIAGNOSIS — K449 Diaphragmatic hernia without obstruction or gangrene: Secondary | ICD-10-CM | POA: Diagnosis not present

## 2022-08-29 DIAGNOSIS — R7301 Impaired fasting glucose: Secondary | ICD-10-CM | POA: Diagnosis not present

## 2022-08-29 DIAGNOSIS — E538 Deficiency of other specified B group vitamins: Secondary | ICD-10-CM | POA: Diagnosis not present

## 2022-08-29 DIAGNOSIS — L309 Dermatitis, unspecified: Secondary | ICD-10-CM | POA: Diagnosis not present

## 2022-08-29 DIAGNOSIS — E782 Mixed hyperlipidemia: Secondary | ICD-10-CM | POA: Diagnosis not present

## 2022-08-29 DIAGNOSIS — I700 Atherosclerosis of aorta: Secondary | ICD-10-CM | POA: Diagnosis not present

## 2022-08-29 DIAGNOSIS — J439 Emphysema, unspecified: Secondary | ICD-10-CM | POA: Diagnosis not present

## 2022-08-29 DIAGNOSIS — N529 Male erectile dysfunction, unspecified: Secondary | ICD-10-CM | POA: Diagnosis not present

## 2022-08-29 DIAGNOSIS — Z125 Encounter for screening for malignant neoplasm of prostate: Secondary | ICD-10-CM | POA: Diagnosis not present

## 2022-08-29 DIAGNOSIS — Z0000 Encounter for general adult medical examination without abnormal findings: Secondary | ICD-10-CM | POA: Diagnosis not present

## 2023-06-26 ENCOUNTER — Encounter: Payer: Self-pay | Admitting: Acute Care

## 2023-09-05 DIAGNOSIS — E538 Deficiency of other specified B group vitamins: Secondary | ICD-10-CM | POA: Diagnosis not present

## 2023-09-05 DIAGNOSIS — J309 Allergic rhinitis, unspecified: Secondary | ICD-10-CM | POA: Diagnosis not present

## 2023-09-05 DIAGNOSIS — E782 Mixed hyperlipidemia: Secondary | ICD-10-CM | POA: Diagnosis not present

## 2023-09-05 DIAGNOSIS — R7301 Impaired fasting glucose: Secondary | ICD-10-CM | POA: Diagnosis not present

## 2023-09-05 DIAGNOSIS — N400 Enlarged prostate without lower urinary tract symptoms: Secondary | ICD-10-CM | POA: Diagnosis not present

## 2023-09-05 DIAGNOSIS — Z0000 Encounter for general adult medical examination without abnormal findings: Secondary | ICD-10-CM | POA: Diagnosis not present

## 2023-09-05 DIAGNOSIS — N529 Male erectile dysfunction, unspecified: Secondary | ICD-10-CM | POA: Diagnosis not present

## 2023-09-05 DIAGNOSIS — I700 Atherosclerosis of aorta: Secondary | ICD-10-CM | POA: Diagnosis not present

## 2023-09-05 DIAGNOSIS — J439 Emphysema, unspecified: Secondary | ICD-10-CM | POA: Diagnosis not present
